# Patient Record
Sex: Male | Born: 1975 | Race: White | Hispanic: Yes | Smoking: Former smoker
Health system: Southern US, Community
[De-identification: ages and names within clinical notes are randomized; demographics above are authoritative.]

## PROBLEM LIST (undated history)

## (undated) DIAGNOSIS — M6780 Other specified disorders of synovium and tendon, unspecified site: Secondary | ICD-10-CM

## (undated) DIAGNOSIS — M24541 Contracture, right hand: Secondary | ICD-10-CM

---

## 2011-10-23 ENCOUNTER — Ambulatory Visit: Payer: Self-pay

## 2011-10-23 LAB — CBC WITH DIFFERENTIAL/PLATELET
Basophil #: 0 10*3/uL (ref 0.0–0.1)
HCT: 48.5 % (ref 40.0–52.0)
Lymphocyte #: 2.3 10*3/uL (ref 1.0–3.6)
MCH: 31.3 pg (ref 26.0–34.0)
MCHC: 34.4 g/dL (ref 32.0–36.0)
MCV: 91 fL (ref 80–100)
Monocyte #: 0.6 x10 3/mm (ref 0.2–1.0)
Monocyte %: 6.5 %
Neutrophil #: 6.3 10*3/uL (ref 1.4–6.5)
Platelet: 264 10*3/uL (ref 150–440)
RDW: 13 % (ref 11.5–14.5)
WBC: 9.5 10*3/uL (ref 3.8–10.6)

## 2011-10-23 LAB — COMPREHENSIVE METABOLIC PANEL
Albumin: 4.2 g/dL (ref 3.4–5.0)
Anion Gap: 8 (ref 7–16)
BUN: 13 mg/dL (ref 7–18)
Glucose: 112 mg/dL — ABNORMAL HIGH (ref 65–99)
Osmolality: 277 (ref 275–301)
Potassium: 3.9 mmol/L (ref 3.5–5.1)
Sodium: 138 mmol/L (ref 136–145)
Total Protein: 7.9 g/dL (ref 6.4–8.2)

## 2011-11-03 ENCOUNTER — Ambulatory Visit: Payer: Self-pay | Admitting: Internal Medicine

## 2012-09-13 ENCOUNTER — Ambulatory Visit: Payer: Self-pay

## 2012-09-17 ENCOUNTER — Emergency Department (HOSPITAL_COMMUNITY): Payer: Self-pay

## 2012-09-17 ENCOUNTER — Observation Stay (HOSPITAL_COMMUNITY)
Admission: EM | Admit: 2012-09-17 | Discharge: 2012-09-18 | Disposition: A | Discharge status: Home / Self Care | Payer: Self-pay | Attending: General Surgery | Admitting: General Surgery

## 2012-09-17 ENCOUNTER — Emergency Department (HOSPITAL_COMMUNITY): Payer: Self-pay | Admitting: Anesthesiology

## 2012-09-17 ENCOUNTER — Encounter (HOSPITAL_COMMUNITY): Payer: Self-pay | Admitting: Anesthesiology

## 2012-09-17 ENCOUNTER — Encounter (HOSPITAL_COMMUNITY)
Admission: EM | Disposition: A | Discharge status: Home / Self Care | Payer: Self-pay | Source: Home / Self Care | Attending: Emergency Medicine

## 2012-09-17 ENCOUNTER — Encounter (HOSPITAL_COMMUNITY): Payer: Self-pay | Admitting: Emergency Medicine

## 2012-09-17 DIAGNOSIS — K612 Anorectal abscess: Principal | ICD-10-CM | POA: Insufficient documentation

## 2012-09-17 DIAGNOSIS — K611 Rectal abscess: Secondary | ICD-10-CM

## 2012-09-17 DIAGNOSIS — Z79899 Other long term (current) drug therapy: Secondary | ICD-10-CM | POA: Insufficient documentation

## 2012-09-17 HISTORY — PX: INCISION AND DRAINAGE PERIRECTAL ABSCESS: SHX1804

## 2012-09-17 LAB — CBC
HCT: 41.8 % (ref 39.0–52.0)
MCH: 31.2 pg (ref 26.0–34.0)
MCV: 88.7 fL (ref 78.0–100.0)
Platelets: 314 10*3/uL (ref 150–400)
RBC: 4.71 MIL/uL (ref 4.22–5.81)

## 2012-09-17 LAB — BASIC METABOLIC PANEL
BUN: 9 mg/dL (ref 6–23)
CO2: 28 mEq/L (ref 19–32)
Calcium: 9.8 mg/dL (ref 8.4–10.5)
Creatinine, Ser: 0.7 mg/dL (ref 0.50–1.35)
Glucose, Bld: 104 mg/dL — ABNORMAL HIGH (ref 70–99)

## 2012-09-17 SURGERY — INCISION AND DRAINAGE, ABSCESS, PERIRECTAL
Anesthesia: General | Site: Buttocks | Wound class: Dirty or Infected

## 2012-09-17 MED ORDER — ONDANSETRON HCL 4 MG/2ML IJ SOLN
INTRAMUSCULAR | Status: DC | PRN
  Administered 2012-09-17: 4 mg via INTRAVENOUS

## 2012-09-17 MED ORDER — ONDANSETRON HCL 4 MG PO TABS: 4.0000 mg | ORAL_TABLET | Freq: Four times a day (QID) | ORAL | Status: DC | PRN

## 2012-09-17 MED ORDER — ARTIFICIAL TEARS OP OINT
TOPICAL_OINTMENT | OPHTHALMIC | Status: DC | PRN
  Administered 2012-09-17: 1 via OPHTHALMIC

## 2012-09-17 MED ORDER — HYDROMORPHONE HCL PF 1 MG/ML IJ SOLN
INTRAMUSCULAR | Status: AC
  Filled 2012-09-17: qty 1

## 2012-09-17 MED ORDER — METRONIDAZOLE IN NACL 5-0.79 MG/ML-% IV SOLN
500.0000 mg | Freq: Three times a day (TID) | INTRAVENOUS | Status: DC
  Administered 2012-09-17 – 2012-09-18 (×2): 500 mg via INTRAVENOUS
  Filled 2012-09-17 (×4): qty 100

## 2012-09-17 MED ORDER — PROPOFOL 10 MG/ML IV BOLUS
INTRAVENOUS | Status: DC | PRN
  Administered 2012-09-17: 200 mg via INTRAVENOUS

## 2012-09-17 MED ORDER — PANTOPRAZOLE SODIUM 40 MG PO TBEC
40.0000 mg | DELAYED_RELEASE_TABLET | Freq: Every day | ORAL | Status: DC
  Administered 2012-09-17 – 2012-09-18 (×2): 40 mg via ORAL
  Filled 2012-09-17: qty 1

## 2012-09-17 MED ORDER — BUPIVACAINE HCL 0.25 % IJ SOLN
INTRAMUSCULAR | Status: DC | PRN
  Administered 2012-09-17: 20 mL

## 2012-09-17 MED ORDER — MORPHINE SULFATE 4 MG/ML IJ SOLN
4.0000 mg | Freq: Once | INTRAMUSCULAR | Status: AC
  Administered 2012-09-17: 4 mg via INTRAVENOUS
  Filled 2012-09-17: qty 1

## 2012-09-17 MED ORDER — FENTANYL CITRATE 0.05 MG/ML IJ SOLN
INTRAMUSCULAR | Status: DC | PRN
  Administered 2012-09-17: 250 ug via INTRAVENOUS

## 2012-09-17 MED ORDER — METOCLOPRAMIDE HCL 5 MG/ML IJ SOLN: 10.0000 mg | Freq: Once | INTRAMUSCULAR | Status: DC | PRN

## 2012-09-17 MED ORDER — ONDANSETRON HCL 4 MG/2ML IJ SOLN: 4.0000 mg | Freq: Four times a day (QID) | INTRAMUSCULAR | Status: DC | PRN

## 2012-09-17 MED ORDER — HYDROMORPHONE HCL PF 1 MG/ML IJ SOLN
0.2500 mg | INTRAMUSCULAR | Status: DC | PRN
  Administered 2012-09-17 (×2): 0.5 mg via INTRAVENOUS

## 2012-09-17 MED ORDER — OXYCODONE HCL 5 MG/5ML PO SOLN: 5.0000 mg | Freq: Once | ORAL | Status: DC | PRN

## 2012-09-17 MED ORDER — HEPARIN SODIUM (PORCINE) 5000 UNIT/ML IJ SOLN
5000.0000 [IU] | Freq: Three times a day (TID) | INTRAMUSCULAR | Status: DC
  Administered 2012-09-18: 5000 [IU] via SUBCUTANEOUS
  Filled 2012-09-17 (×3): qty 1

## 2012-09-17 MED ORDER — CIPROFLOXACIN IN D5W 400 MG/200ML IV SOLN
400.0000 mg | Freq: Once | INTRAVENOUS | Status: AC
  Administered 2012-09-17: 400 mg via INTRAVENOUS
  Filled 2012-09-17: qty 200

## 2012-09-17 MED ORDER — CIPROFLOXACIN IN D5W 400 MG/200ML IV SOLN
INTRAVENOUS | Status: DC | PRN
  Administered 2012-09-17: 400 mg via INTRAVENOUS

## 2012-09-17 MED ORDER — LACTATED RINGERS IV SOLN
INTRAVENOUS | Status: DC | PRN
  Administered 2012-09-17: 14:00:00 via INTRAVENOUS

## 2012-09-17 MED ORDER — METRONIDAZOLE IN NACL 5-0.79 MG/ML-% IV SOLN
500.0000 mg | Freq: Once | INTRAVENOUS | Status: AC
  Administered 2012-09-17: 500 mg via INTRAVENOUS
  Filled 2012-09-17: qty 100

## 2012-09-17 MED ORDER — KCL IN DEXTROSE-NACL 20-5-0.45 MEQ/L-%-% IV SOLN
INTRAVENOUS | Status: DC
  Administered 2012-09-17: 17:00:00 via INTRAVENOUS
  Filled 2012-09-17 (×3): qty 1000

## 2012-09-17 MED ORDER — OXYCODONE HCL 5 MG PO TABS: 5.0000 mg | ORAL_TABLET | Freq: Once | ORAL | Status: DC | PRN

## 2012-09-17 MED ORDER — LIDOCAINE HCL (CARDIAC) 20 MG/ML IV SOLN
INTRAVENOUS | Status: DC | PRN
  Administered 2012-09-17: 100 mg via INTRAVENOUS

## 2012-09-17 MED ORDER — ONDANSETRON 4 MG PO TBDP
4.0000 mg | ORAL_TABLET | Freq: Once | ORAL | Status: AC
  Administered 2012-09-17: 4 mg via ORAL
  Filled 2012-09-17: qty 1

## 2012-09-17 MED ORDER — MORPHINE SULFATE 2 MG/ML IJ SOLN
2.0000 mg | INTRAMUSCULAR | Status: DC | PRN
  Administered 2012-09-17 (×2): 2 mg via INTRAVENOUS
  Filled 2012-09-17 (×2): qty 1

## 2012-09-17 MED ORDER — IOHEXOL 300 MG/ML  SOLN
125.0000 mL | Freq: Once | INTRAMUSCULAR | Status: AC | PRN
  Administered 2012-09-17: 125 mL via INTRAVENOUS

## 2012-09-17 MED ORDER — MIDAZOLAM HCL 5 MG/5ML IJ SOLN
INTRAMUSCULAR | Status: DC | PRN
  Administered 2012-09-17: 2 mg via INTRAVENOUS

## 2012-09-17 MED ORDER — IOHEXOL 300 MG/ML  SOLN
25.0000 mL | Freq: Once | INTRAMUSCULAR | Status: AC | PRN
  Administered 2012-09-17: 25 mL via ORAL

## 2012-09-17 MED ORDER — HYDROMORPHONE HCL PF 1 MG/ML IJ SOLN
1.0000 mg | Freq: Once | INTRAMUSCULAR | Status: AC
  Administered 2012-09-17: 1 mg via INTRAVENOUS
  Filled 2012-09-17: qty 1

## 2012-09-17 MED ORDER — 0.9 % SODIUM CHLORIDE (POUR BTL) OPTIME
TOPICAL | Status: DC | PRN
  Administered 2012-09-17: 1000 mL

## 2012-09-17 MED ORDER — BUPIVACAINE-EPINEPHRINE PF 0.25-1:200000 % IJ SOLN
INTRAMUSCULAR | Status: AC
  Filled 2012-09-17: qty 30

## 2012-09-17 MED ORDER — CIPROFLOXACIN IN D5W 400 MG/200ML IV SOLN
400.0000 mg | Freq: Two times a day (BID) | INTRAVENOUS | Status: DC
  Administered 2012-09-18: 400 mg via INTRAVENOUS
  Filled 2012-09-17 (×3): qty 200

## 2012-09-17 MED ORDER — HYDROCODONE-ACETAMINOPHEN 5-325 MG PO TABS
1.0000 | ORAL_TABLET | ORAL | Status: DC | PRN
  Administered 2012-09-17 – 2012-09-18 (×3): 2 via ORAL
  Filled 2012-09-17 (×3): qty 2

## 2012-09-17 MED ORDER — SUCCINYLCHOLINE CHLORIDE 20 MG/ML IJ SOLN
INTRAMUSCULAR | Status: DC | PRN
  Administered 2012-09-17: 100 mg via INTRAVENOUS

## 2012-09-17 SURGICAL SUPPLY — 32 items
BLADE SURG 15 STRL LF DISP TIS (BLADE) ×1 IMPLANT
BLADE SURG 15 STRL SS (BLADE) ×1
CANISTER SUCTION 2500CC (MISCELLANEOUS) ×2 IMPLANT
CLEANER TIP ELECTROSURG 2X2 (MISCELLANEOUS) ×2 IMPLANT
CLOTH BEACON ORANGE TIMEOUT ST (SAFETY) ×2 IMPLANT
COVER SURGICAL LIGHT HANDLE (MISCELLANEOUS) ×2 IMPLANT
DRAPE UTILITY 15X26 W/TAPE STR (DRAPE) ×4 IMPLANT
DRSG PAD ABDOMINAL 8X10 ST (GAUZE/BANDAGES/DRESSINGS) IMPLANT
ELECT REM PT RETURN 9FT ADLT (ELECTROSURGICAL) ×2
ELECTRODE REM PT RTRN 9FT ADLT (ELECTROSURGICAL) ×1 IMPLANT
GAUZE PACKING IODOFORM 1 (PACKING) ×2 IMPLANT
GAUZE SPONGE 4X4 16PLY XRAY LF (GAUZE/BANDAGES/DRESSINGS) ×2 IMPLANT
GLOVE BIO SURGEON STRL SZ8 (GLOVE) ×2 IMPLANT
GLOVE BIOGEL PI IND STRL 8 (GLOVE) ×1 IMPLANT
GLOVE BIOGEL PI INDICATOR 8 (GLOVE) ×1
GOWN STRL NON-REIN LRG LVL3 (GOWN DISPOSABLE) ×4 IMPLANT
GOWN STRL REIN XL XLG (GOWN DISPOSABLE) ×2 IMPLANT
KIT BASIN OR (CUSTOM PROCEDURE TRAY) ×2 IMPLANT
KIT ROOM TURNOVER OR (KITS) ×2 IMPLANT
NS IRRIG 1000ML POUR BTL (IV SOLUTION) ×2 IMPLANT
PACK LITHOTOMY IV (CUSTOM PROCEDURE TRAY) ×2 IMPLANT
PAD ARMBOARD 7.5X6 YLW CONV (MISCELLANEOUS) ×4 IMPLANT
PENCIL BUTTON HOLSTER BLD 10FT (ELECTRODE) ×2 IMPLANT
SPONGE GAUZE 4X4 12PLY (GAUZE/BANDAGES/DRESSINGS) ×2 IMPLANT
SWAB COLLECTION DEVICE MRSA (MISCELLANEOUS) ×2 IMPLANT
TOWEL OR 17X24 6PK STRL BLUE (TOWEL DISPOSABLE) ×2 IMPLANT
TOWEL OR 17X26 10 PK STRL BLUE (TOWEL DISPOSABLE) ×2 IMPLANT
TUBE ANAEROBIC SPECIMEN COL (MISCELLANEOUS) ×2 IMPLANT
TUBE CONNECTING 12X1/4 (SUCTIONS) ×2 IMPLANT
UNDERPAD 30X30 INCONTINENT (UNDERPADS AND DIAPERS) ×2 IMPLANT
WATER STERILE IRR 1000ML POUR (IV SOLUTION) IMPLANT
YANKAUER SUCT BULB TIP NO VENT (SUCTIONS) ×2 IMPLANT

## 2012-09-17 NOTE — ED Notes (Signed)
Pt fiance has pt belongings

## 2012-09-17 NOTE — Transfer of Care (Signed)
Immediate Anesthesia Transfer of Care Note  Patient: [name] [name]  Procedure(s) Performed: Procedure(s): IRRIGATION AND DEBRIDEMENT PERIRECTAL ABSCESS (N/A)  Patient Location: PACU  Anesthesia Type:General  Level of Consciousness: awake, alert , oriented, patient cooperative and responds to stimulation  Airway & Oxygen Therapy: Patient Spontanous Breathing and Patient connected to nasal cannula oxygen  Post-op Assessment: Report given to PACU RN, Post -op Vital signs reviewed and stable and Patient moving all extremities X 4  Post vital signs: Reviewed and stable  Complications: No apparent anesthesia complications

## 2012-09-17 NOTE — ED Notes (Addendum)
Pt c/o rectal abscess onset Monday. Pt seen by PMD for same and given prescription meds, pt seen at [facility] and was told it needed to be removed by surgeon. Pt took 2 hydrocodone at 0400.

## 2012-09-17 NOTE — Preoperative (Signed)
Beta Blockers   Reason not to administer Beta Blockers:Not Applicable 

## 2012-09-17 NOTE — H&P (Signed)
[name] [name] is an 37 y.o. male.   Chief Complaint: Left perirectal pain HPI: Patient complains of (for pain for the past 7 days. He was seen in an urgent care and prescribed doxycycline, hydrocodone, and some hemorrhoid ointment. He has not had any relief. He has not had any drainage from the area. It is keeping him from sleeping. He was evaluated emergency department today. Workup shows mild leukocytosis and CT scan of the abdomen and pelvis shows left perirectal abscess.  Past medical history: Negative  Past surgical history: Negative  No family history on file. Social History: Does not smoke, drinks multiple 24 ounce beers daily after work  Allergies: No Known Allergies   (Not in a hospital admission)  Results for orders placed during the hospital encounter of 09/17/12 (from the past 48 hour(s))  CBC     Status: Abnormal   Collection Time    09/17/12 10:55 AM      Result Value Range   WBC 13.0 (*) 4.0 - 10.5 K/uL   RBC 4.71  4.22 - 5.81 MIL/uL   Hemoglobin 14.7  13.0 - 17.0 g/dL   HCT 16.1  09.6 - 04.5 %   MCV 88.7  78.0 - 100.0 fL   MCH 31.2  26.0 - 34.0 pg   MCHC 35.2  30.0 - 36.0 g/dL   RDW 40.9  81.1 - 91.4 %   Platelets 314  150 - 400 K/uL  BASIC METABOLIC PANEL     Status: Abnormal   Collection Time    09/17/12 10:55 AM      Result Value Range   Sodium 137  135 - 145 mEq/L   Potassium 3.3 (*) 3.5 - 5.1 mEq/L   Chloride 99  96 - 112 mEq/L   CO2 28  19 - 32 mEq/L   Glucose, Bld 104 (*) 70 - 99 mg/dL   BUN 9  6 - 23 mg/dL   Creatinine, Ser 7.82  0.50 - 1.35 mg/dL   Calcium 9.8  8.4 - 95.6 mg/dL   GFR calc non Af Amer >90  >90 mL/min   GFR calc Af Amer >90  >90 mL/min   Comment:            The eGFR has been calculated     using the CKD EPI equation.     This calculation has not been     validated in all clinical     situations.     eGFR's persistently     <90 mL/min signify     possible Chronic Kidney Disease.   Ct Abdomen Pelvis W Contrast  09/17/2012    *RADIOLOGY REPORT*  Clinical Data: Evaluate for abscess.  CT ABDOMEN AND PELVIS WITH CONTRAST  Technique:  Multidetector CT imaging of the abdomen and pelvis was performed following the standard protocol during bolus administration of intravenous contrast.  Contrast: 25mL OMNIPAQUE IOHEXOL 300 MG/ML  SOLN, OMNIPAQUE IOHEXOL 300 MG/ML  SOLN  Comparison: None.  Findings:  There is no pleural effusion identified.  The lung bases appear clear.  No focal liver abnormality.  The gallbladder is normal.  No biliary dilatation.  Normal appearance of the pancreas.  The spleen is normal.  The adrenal glands are both within normal limits.  Right kidney is normal.  The left kidney is normal.  The urinary bladder is unremarkable.  Normal caliber of the abdominal aorta.  No upper abdominal adenopathy.  No pelvic or inguinal adenopathy identified.  The stomach appears normal.  The small bowel loops are unremarkable.  The appendix is visualized and appears normal. Normal caliber of the colon.  There is a peripherally enhancing, low attenuation structure within the left side, perirectal soft tissues.  This measures 3.8 x 1.1 x 3.0 cm.  Review of the visualized osseous structures was unremarkable.  IMPRESSION:  1.  Exam positive for perirectal fluid collection compatible with abscess. 2.  No intra-abdominal or pelvic abnormalities.   Original Report Authenticated By: Signa Kell, M.D.    Review of Systems  Constitutional: Positive for malaise/fatigue.  HENT: Negative.   Eyes: Negative.   Respiratory: Negative.   Cardiovascular: Negative.   Gastrointestinal: Negative for abdominal pain and blood in stool.       See HPI  Genitourinary: Negative.   Musculoskeletal: Negative.   Skin: Negative.   Neurological: Negative.   Endo/Heme/Allergies: Negative.   Psychiatric/Behavioral: Negative.     Blood pressure 117/65, pulse 76, temperature 98.9 F (37.2 C), temperature source Oral, resp. rate 20, height 5\' 5"   (1.651 m), weight 78.019 kg (172 lb), SpO2 100.00%. Physical Exam  Constitutional: He is oriented to person, place, and time. He appears well-developed and well-nourished. He appears distressed.  HENT:  Head: Normocephalic and atraumatic.  Mouth/Throat: No oropharyngeal exudate.  Eyes: EOM are normal. Pupils are equal, round, and reactive to light. No scleral icterus.  Neck: Normal range of motion. Neck supple. No tracheal deviation present.  Cardiovascular: Normal rate, regular rhythm, normal heart sounds and intact distal pulses.   No murmur heard. Respiratory: Effort normal and breath sounds normal. No stridor. No respiratory distress. He has no wheezes. He has no rales. He exhibits no tenderness.  GI: Soft. He exhibits no distension. There is no tenderness. There is no rebound and no guarding.  Genitourinary:  Left pararectal region with no overlying skin changes but palpable tender mass consistent with location of the abscess  Musculoskeletal: Normal range of motion. He exhibits no edema and no tenderness.  Neurological: He is alert and oriented to person, place, and time. He exhibits normal muscle tone.  Skin: Skin is warm.     Assessment/Plan (Collapse S.: We'll give IV antibiotics and take to the operating room for incision and drainage. Procedure, risks, and benefits were discussed in detail with the patient. He agrees. We will plan to keep him overnight for further antibiotic treatment and wound care.  Sawyer Kahan E 09/17/2012, 1:18 PM

## 2012-09-17 NOTE — ED Provider Notes (Signed)
   History    CSN: [id number] Arrival date & time 09/17/12  4098  First MD Initiated Contact with Patient 09/17/12 1033     Chief Complaint  Patient presents with  . Abscess    rectal   (Consider location/radiation/quality/duration/timing/severity/associated sxs/prior Treatment) HPI  [name] [name] is a(n) 37 y.o. male who presents with chief complaint of rectal abscess.  Patient states he has had worsening pain in the rectum over the past week.  [facility] and told that he needed to follow up with a surgeon.  Patient was started on doxycycline, ketoconazole cream to apply externally to the rectum and pain medication.  It has gotten increasingly worse since that time.  The patient sought medical attention here at the emergency department.  He states he's been afebrile over yes had myalgias, chills and night sweats. He denies history of similar symptoms.  He denies a history of ulcerative colitis or Crohn's disease. He has pain with defecation but denies any hematochezia, melena or diarrhea.  Patient denies any urinary symptoms.  History reviewed. No pertinent past medical history. History reviewed. No pertinent past surgical history. No family history on file. History  Substance Use Topics  . Smoking status: Never Smoker   . Smokeless tobacco: Not on file  . Alcohol Use: Yes    Review of Systems Ten systems reviewed and are negative for acute change, except as noted in the HPI.   Allergies  Review of patient's allergies indicates no known allergies.  Home Medications  No current outpatient prescriptions on file. BP 130/79  Pulse 81  Temp(Src) 98.9 F (37.2 C) (Oral)  Resp 20  Ht 5\' 5"  (1.651 m)  Wt 172 lb (78.019 kg)  BMI 28.62 kg/m2  SpO2 100% Physical Exam Physical Exam  Nursing note and vitals reviewed. Constitutional: He appears well-developed and well-nourished. No distress.  HENT:  Head: Normocephalic and atraumatic.  Eyes:  Conjunctivae normal are normal. No scleral icterus.  Neck: Normal range of motion. Neck supple.  Cardiovascular: Normal rate, regular rhythm and normal heart sounds.   Pulmonary/Chest: Effort normal and breath sounds normal. No respiratory distress.  Abdominal: Soft. There is no tenderness.  Musculoskeletal: He exhibits no edema.  Neurological: He is alert.  Skin: Skin is warm and dry. He is not diaphoretic.  Psychiatric: His behavior is normal.  Rectal exam: tenderness noted at 3 O'clock. Minimally indurated. No fluctuance.    ED Course  Procedures (including critical care time) Labs Reviewed - No data to display No results found. 1. Perirectal abscess     MDM  12:39 PM BP 117/65  Pulse 76  Temp(Src) 98.9 F (37.2 C) (Oral)  Resp 20  Ht 5\' 5"  (1.651 m)  Wt 172 lb (78.019 kg)  BMI 28.62 kg/m2  SpO2 100% Patient CT positive for  Deep perirectal abscess. Elevated white count. Afebrile.  Pain in better control with dilaudid. I have spoken with Dr. Janee Morn who has agreed to see the patient. Patient given IV Cipro.      Arthor Captain, PA-C 09/17/12 1607

## 2012-09-17 NOTE — Anesthesia Postprocedure Evaluation (Signed)
Anesthesia Post Note  Patient: [name] [name]  Procedure(s) Performed: Procedure(s) (LRB): IRRIGATION AND DEBRIDEMENT PERIRECTAL ABSCESS (N/A)  Anesthesia type: General  Patient location: PACU  Post pain: Pain level controlled  Post assessment: Patient's Cardiovascular Status Stable  Last Vitals:  Filed Vitals:   09/17/12 1600  BP: 122/55  Pulse: 88  Temp: 36.8 C  Resp: 16    Post vital signs: Reviewed and stable  Level of consciousness: alert  Complications: No apparent anesthesia complications

## 2012-09-17 NOTE — ED Notes (Signed)
Pt has returned from ct 

## 2012-09-17 NOTE — Anesthesia Preprocedure Evaluation (Addendum)
Anesthesia Evaluation  Patient identified by MRN, date of birth, ID band Patient awake    Reviewed: Allergy & Precautions, H&P , NPO status , Patient's Chart, lab work & pertinent test results, reviewed documented beta blocker date and time   Airway Mallampati: II TM Distance: >3 FB Neck ROM: full    Dental  (+) Teeth Intact and Dental Advidsory Given   Pulmonary neg pulmonary ROS,  breath sounds clear to auscultation        Cardiovascular negative cardio ROS  Rhythm:regular     Neuro/Psych negative neurological ROS  negative psych ROS   GI/Hepatic negative GI ROS, Neg liver ROS,   Endo/Other  negative endocrine ROS  Renal/GU negative Renal ROS  negative genitourinary   Musculoskeletal   Abdominal   Peds  Hematology negative hematology ROS (+)   Anesthesia Other Findings See surgeon's H&P   Reproductive/Obstetrics negative OB ROS                          Anesthesia Physical Anesthesia Plan  ASA: I and emergent  Anesthesia Plan: General   Post-op Pain Management:    Induction: Intravenous, Rapid sequence and Cricoid pressure planned  Airway Management Planned: Oral ETT  Additional Equipment:   Intra-op Plan:   Post-operative Plan: Extubation in OR  Informed Consent: I have reviewed the patients History and Physical, chart, labs and discussed the procedure including the risks, benefits and alternatives for the proposed anesthesia with the patient or authorized representative who has indicated his/her understanding and acceptance.   Dental Advisory Given  Plan Discussed with: CRNA, Surgeon and Anesthesiologist  Anesthesia Plan Comments:        Anesthesia Quick Evaluation

## 2012-09-17 NOTE — Op Note (Signed)
09/17/2012  2:44 PM  PATIENT:  [name] [name]  37 y.o. male  PRE-OPERATIVE DIAGNOSIS:  Large Left perirectal abscess  POST-OPERATIVE DIAGNOSIS:  Large Left perirectal abscess  PROCEDURE:  Procedure(s): IRRIGATION AND DEBRIDEMENT PERIRECTAL ABSCESS  SURGEON:  Surgeon(s): Liz Malady, MD  PHYSICIAN ASSISTANT:   ASSISTANTS: none   ANESTHESIA:   local and general  EBL:     BLOOD ADMINISTERED:none  DRAINS: none   SPECIMEN:  No Specimen  DISPOSITION OF SPECIMEN:  N/A  COUNTS:  YES  DICTATION: .Dragon Dictation  Patient presented to the emergency department with one week of left perirectal pain. He was seen as an urgent care a couple of days ago and given doxycycline and pain medicine. He continued to have severe pain. Workup in the emergency department demonstrated a large left perirectal abscess. He is brought emergently for incision and drainage.He was given intravenous antibiotics. Informed consent was obtained. He was brought to the operating room and general endotracheal anesthesia was administered by the anesthesia staff. He was placed in lithotomy position. Perineum was prepped and draped in sterile fashion. Incision was made on the left side of the pararectal region over a deeply palpable mass. Subcutaneous tissues were dissected down and entered the abscess cavity. There is a large volume of pus which was evacuated. This was sent for cultures. Loculations were broken down and the entire cavity was opened. It extended the skin incision some to facilitate this. Next hemostasis was obtained with cautery. The wound was copiously irrigated with saline. Local anesthetic was injected. There was good hemostasis and the wound was packed with one-inch iodoform gauze. Bulky sterile dressing was applied. Patient tolerated procedure well without apparent complication. All counts were correct. He was taken recovery in stable condition.  PATIENT DISPOSITION:  PACU - hemodynamically  stable.   Delay start of Pharmacological VTE agent (>24hrs) due to surgical blood loss or risk of bleeding:  no  Violeta Gelinas, MD, MPH, FACS Pager: [phone]  7/20/20142:44 PM

## 2012-09-18 ENCOUNTER — Encounter (HOSPITAL_COMMUNITY): Payer: Self-pay | Admitting: General Surgery

## 2012-09-18 LAB — BASIC METABOLIC PANEL
GFR calc Af Amer: >90 mL/min (ref >90)
GFR calc non Af Amer: >90 mL/min (ref >90)
Potassium: 3.7 mEq/L (ref 3.5–5.1)
Sodium: 140 mEq/L (ref 135–145)

## 2012-09-18 LAB — CBC
MCHC: 33.3 g/dL (ref 30.0–36.0)
Platelets: 329 10*3/uL (ref 150–400)
RDW: 12.3 % (ref 11.5–15.5)

## 2012-09-18 MED ORDER — DOXYCYCLINE HYCLATE 100 MG PO TABS: 100.0000 mg | ORAL_TABLET | Freq: Two times a day (BID) | ORAL | Status: DC

## 2012-09-18 MED ORDER — METRONIDAZOLE 500 MG PO TABS: 500.0000 mg | ORAL_TABLET | Freq: Three times a day (TID) | ORAL | Status: DC

## 2012-09-18 MED ORDER — CIPROFLOXACIN HCL 500 MG PO TABS
500.0000 mg | ORAL_TABLET | Freq: Two times a day (BID) | ORAL | Status: DC
  Administered 2012-09-18: 500 mg via ORAL
  Filled 2012-09-18: qty 1

## 2012-09-18 MED ORDER — METRONIDAZOLE 500 MG PO TABS
500.0000 mg | ORAL_TABLET | Freq: Three times a day (TID) | ORAL | Status: DC
  Filled 2012-09-18: qty 1

## 2012-09-18 MED ORDER — CIPROFLOXACIN HCL 500 MG PO TABS: 500.0000 mg | ORAL_TABLET | Freq: Two times a day (BID) | ORAL | Status: DC

## 2012-09-18 MED ORDER — HYDROCODONE-ACETAMINOPHEN 5-325 MG PO TABS: 1.0000 | ORAL_TABLET | ORAL | Status: DC | PRN

## 2012-09-18 NOTE — Discharge Summary (Signed)
Physician Discharge Summary  [name] [name] ZOX:[id number] DOB: 1975/03/06 DOA: 09/17/2012   Consultation: none  Admit date: 09/17/2012 Discharge date: 09/18/2012  Recommendations for Outpatient Follow-up:  1.  recommended home health for dressing changes  Follow-up Information   Follow up with Liz Malady, MD On 10/02/2012. (appointment:3:15PM.  Please arrive prior to your appointment to complete check in)    Contact information:   [address] [place] [address] [phone]      Discharge Diagnoses:  1. Perirectal abscess   Surgical Procedure: Irrigation and Debridement of perirectal abscess(Dr. Janee Morn 09/17/12)  Discharge Condition: stable Disposition: home  Diet recommendation: regular  Filed Weights   09/17/12 1017  Weight: 172 lb (78.019 kg)    Filed Vitals:   09/18/12 0955  BP: 128/68  Pulse: 79  Temp: 98.4 F (36.9 C)  Resp: 19     Hospital Course:  [name] [name] is a healthy 37 year old Hispanic male who presented to [facility] with left sided perirectal pain.  He was prescribed doxycycline, pain medication and hemorrhoid cream at the urgent care, did not help his symptoms.  He underwent a CT which showed an abscess.  He then underwent and I&D.  On POD #1 his pain was under good control with oral pain medication.  His dressing was changed which he tolerated well.  He had stable vital signs and improving WBC.  He was voiding, having bms, ambulating and tolerating a regular diet.  He was therefore felt stable for discharge. We recommended home health, but he was unable to afford.  His significant other and a friend are nurses and will help with dressing changes.  He was given supplies and rx for pain medication and atbx.  We discussed medication risks and benefits.  We discussed warning signs that warrant immediate attention.  A follow up appt was provided for the patient.    Discharge Instructions   Future Appointments Provider Department  Dept Phone   10/02/2012 3:15 PM Liz Malady, MD [facility], [place] [phone]       Medication List    STOP taking these medications       doxycycline 100 MG tablet  Commonly known as:  VIBRA-TABS     HYDROcodone-acetaminophen 10-325 MG per tablet  Commonly known as:  NORCO  Replaced by:  HYDROcodone-acetaminophen 5-325 MG per tablet      TAKE these medications       ciprofloxacin 500 MG tablet  Commonly known as:  CIPRO  Take 1 tablet (500 mg total) by mouth 2 (two) times daily.     HYDROcodone-acetaminophen 5-325 MG per tablet  Commonly known as:  NORCO/VICODIN  Take 1-2 tablets by mouth every 4 (four) hours as needed.     ketoconazole 2 % cream  Commonly known as:  NIZORAL  Apply 1 application topically daily.     metroNIDAZOLE 500 MG tablet  Commonly known as:  FLAGYL  Take 1 tablet (500 mg total) by mouth every 8 (eight) hours.           Follow-up Information   Follow up with Liz Malady, MD On 10/02/2012. (appointment:3:15PM.  Please arrive prior to your appointment to complete check in)    Contact information:   [address] [place] [address] [phone]        The results of significant diagnostics from this hospitalization (including imaging, microbiology, ancillary and laboratory) are listed below for reference.    Significant  Diagnostic Studies: Ct Abdomen Pelvis W Contrast  09/17/2012   *RADIOLOGY REPORT*  Clinical Data: Evaluate for abscess.  CT ABDOMEN AND PELVIS WITH CONTRAST  Technique:  Multidetector CT imaging of the abdomen and pelvis was performed following the standard protocol during bolus administration of intravenous contrast.  Contrast: 25mL OMNIPAQUE IOHEXOL 300 MG/ML  SOLN, OMNIPAQUE IOHEXOL 300 MG/ML  SOLN  Comparison: None.  Findings:  There is no pleural effusion identified.  The lung bases appear clear.  No focal liver abnormality.  The gallbladder is normal.  No biliary dilatation.   Normal appearance of the pancreas.  The spleen is normal.  The adrenal glands are both within normal limits.  Right kidney is normal.  The left kidney is normal.  The urinary bladder is unremarkable.  Normal caliber of the abdominal aorta.  No upper abdominal adenopathy.  No pelvic or inguinal adenopathy identified.  The stomach appears normal.  The small bowel loops are unremarkable.  The appendix is visualized and appears normal. Normal caliber of the colon.  There is a peripherally enhancing, low attenuation structure within the left side, perirectal soft tissues.  This measures 3.8 x 1.1 x 3.0 cm.  Review of the visualized osseous structures was unremarkable.  IMPRESSION:  1.  Exam positive for perirectal fluid collection compatible with abscess. 2.  No intra-abdominal or pelvic abnormalities.   Original Report Authenticated By: Signa Kell, M.D.    Microbiology: Recent Results (from the past 240 hour(s))  CULTURE, ROUTINE-ABSCESS     Status: None   Collection Time    09/17/12  2:37 PM      Result Value Range Status   Specimen Description ABSCESS PERIRECTAL   Final   Special Requests PATIENT ON FOLLOWING ZOSYN AND CIPRO   Final   Gram Stain PENDING   Incomplete   Culture Culture reincubated for better growth   Final   Report Status PENDING   Incomplete  ANAEROBIC CULTURE     Status: None   Collection Time    09/17/12  2:37 PM      Result Value Range Status   Specimen Description ABSCESS PERIRECTAL   Final   Special Requests PATIENT ON FOLLOWING ZOSYN AND CIPRO   Final   Gram Stain PENDING   Incomplete   Culture     Final   Value: NO ANAEROBES ISOLATED; CULTURE IN PROGRESS FOR 5 DAYS   Report Status PENDING   Incomplete     Labs: Basic Metabolic Panel:  Recent Labs Lab 09/17/12 1055 09/18/12 0545  NA 137 140  K 3.3* 3.7  CL 99 99  CO2 28 32  GLUCOSE 104* 104*  BUN 9 7  CREATININE 0.70 0.69  CALCIUM 9.8 9.6   Liver Function Tests: No results found for this basename:  AST, ALT, ALKPHOS, BILITOT, PROT, ALBUMIN,  in the last 168 hours No results found for this basename: LIPASE, AMYLASE,  in the last 168 hours No results found for this basename: AMMONIA,  in the last 168 hours CBC:  Recent Labs Lab 09/17/12 1055 09/18/12 0545  WBC 13.0* 10.6*  HGB 14.7 14.2  HCT 41.8 42.7  MCV 88.7 90.7  PLT 314 329    Time coordinating discharge: 30 mins  Signed:  Phaedra Colgate, ANP-BC

## 2012-09-18 NOTE — Progress Notes (Signed)
1 Day Post-Op  Subjective: He was doing better till I pulled packing out.  Over all he did quite well.  Objective: Vital signs in last 24 hours: Temp:  [98 F (36.7 C)-99.2 F (37.3 C)] 98.2 F (36.8 C) (07/21 0550) Pulse Rate:  [58-88] 66 (07/21 0550) Resp:  [16-20] 16 (07/21 0550) BP: (107-133)/(54-79) 107/61 mmHg (07/21 0550) SpO2:  [95 %-100 %] 98 % (07/21 0550) Weight:  [78.019 kg (172 lb)] 78.019 kg (172 lb) (07/20 1017) Last BM Date: 09/16/12 Afebrile, VSS Labs better  Intake/Output from previous day: 07/20 0701 - 07/21 0700 In: 1650 [I.V.:1350; IV Piggyback:300] Out: 1050 [Urine:1050] Intake/Output this shift:    General appearance: alert, cooperative and no distress Skin: Wound is clean and pretty deep.  I will get some dressing gauze and repack, teach his family how to do it also.    Lab Results:   Recent Labs  09/17/12 1055 09/18/12 0545  WBC 13.0* 10.6*  HGB 14.7 14.2  HCT 41.8 42.7  PLT 314 329    BMET  Recent Labs  09/17/12 1055 09/18/12 0545  NA 137 140  K 3.3* 3.7  CL 99 99  CO2 28 32  GLUCOSE 104* 104*  BUN 9 7  CREATININE 0.70 0.69  CALCIUM 9.8 9.6   PT/INR No results found for this basename: LABPROT, INR,  in the last 72 hours  No results found for this basename: AST, ALT, ALKPHOS, BILITOT, PROT, ALBUMIN,  in the last 168 hours   Lipase  No results found for this basename: lipase     Studies/Results: Ct Abdomen Pelvis W Contrast  09/17/2012   *RADIOLOGY REPORT*  Clinical Data: Evaluate for abscess.  CT ABDOMEN AND PELVIS WITH CONTRAST  Technique:  Multidetector CT imaging of the abdomen and pelvis was performed following the standard protocol during bolus administration of intravenous contrast.  Contrast: 25mL OMNIPAQUE IOHEXOL 300 MG/ML  SOLN, OMNIPAQUE IOHEXOL 300 MG/ML  SOLN  Comparison: None.  Findings:  There is no pleural effusion identified.  The lung bases appear clear.  No focal liver abnormality.  The gallbladder is  normal.  No biliary dilatation.  Normal appearance of the pancreas.  The spleen is normal.  The adrenal glands are both within normal limits.  Right kidney is normal.  The left kidney is normal.  The urinary bladder is unremarkable.  Normal caliber of the abdominal aorta.  No upper abdominal adenopathy.  No pelvic or inguinal adenopathy identified.  The stomach appears normal.  The small bowel loops are unremarkable.  The appendix is visualized and appears normal. Normal caliber of the colon.  There is a peripherally enhancing, low attenuation structure within the left side, perirectal soft tissues.  This measures 3.8 x 1.1 x 3.0 cm.  Review of the visualized osseous structures was unremarkable.  IMPRESSION:  1.  Exam positive for perirectal fluid collection compatible with abscess. 2.  No intra-abdominal or pelvic abnormalities.   Original Report Authenticated By: Signa Kell, M.D.    Medications: . ciprofloxacin  400 mg Intravenous Q12H  . heparin  5,000 Units Subcutaneous Q8H  . metronidazole  500 mg Intravenous Q8H  . pantoprazole  40 mg Oral Daily    Assessment/Plan Large Left perirectal abscess S/p  IRRIGATION AND DEBRIDEMENT PERIRECTAL ABSCESS, 09/17/2012, Liz Malady, MD.     Plan:  Teach family how to do dressing change and home after that. Change Cipro and flagyl to PO form.  LOS: 1 day  [name] [name] 09/18/2012

## 2012-09-18 NOTE — Progress Notes (Signed)
I have seen and examined the patient and agree with the assessment and plans.  Shina Wass A. Haniyah Maciolek  MD, FACS  

## 2012-09-18 NOTE — Progress Notes (Signed)
Patient discharged to home with instructions and verbalized understanding. 

## 2012-09-21 LAB — CULTURE, ROUTINE-ABSCESS

## 2012-09-24 LAB — ANAEROBIC CULTURE

## 2012-10-02 ENCOUNTER — Encounter (INDEPENDENT_AMBULATORY_CARE_PROVIDER_SITE_OTHER): Payer: Self-pay

## 2012-10-02 ENCOUNTER — Ambulatory Visit (INDEPENDENT_AMBULATORY_CARE_PROVIDER_SITE_OTHER): Payer: Self-pay | Admitting: General Surgery

## 2012-10-02 ENCOUNTER — Encounter (INDEPENDENT_AMBULATORY_CARE_PROVIDER_SITE_OTHER): Payer: Self-pay | Admitting: General Surgery

## 2012-10-02 VITALS — BP 120/84 | HR 72 | Temp 97.7°F | Resp 14 | Ht 65.0 in | Wt 164.6 lb

## 2012-10-02 DIAGNOSIS — K612 Anorectal abscess: Secondary | ICD-10-CM

## 2012-10-02 DIAGNOSIS — K611 Rectal abscess: Secondary | ICD-10-CM

## 2012-10-02 NOTE — Progress Notes (Signed)
Subjective:     Patient ID: [name] [name], male   DOB: [birth year], 38 y.o.   MRN: [id number]  HPI Patient status post incision and drainage of perirectal abscess. His wife has been helping him with dressing changes. She claims the wound has gotten much more shallow.  Review of Systems     Objective:   Physical Exam Wound has completely clean granulation tissue with no residual induration, no cellulitis, the wound is very shallow    Assessment:     , Status post incision and drainage of perirectal abscess    Plan:     A return to work on August fifth, 2014. Cover with dry gauze until healed.

## 2012-10-07 NOTE — ED Provider Notes (Signed)
Medical screening examination/treatment/procedure(s) were performed by non-physician practitioner and as supervising physician I was immediately available for consultation/collaboration.    Vida Roller, MD 10/07/12 [phone]

## 2013-12-21 ENCOUNTER — Emergency Department (HOSPITAL_COMMUNITY): Payer: Self-pay

## 2013-12-21 ENCOUNTER — Emergency Department (HOSPITAL_COMMUNITY)
Admission: EM | Admit: 2013-12-21 | Discharge: 2013-12-21 | Disposition: A | Discharge status: Home / Self Care | Payer: Self-pay | Attending: Emergency Medicine | Admitting: Emergency Medicine

## 2013-12-21 ENCOUNTER — Encounter (HOSPITAL_COMMUNITY): Payer: Self-pay | Admitting: Emergency Medicine

## 2013-12-21 DIAGNOSIS — R0602 Shortness of breath: Secondary | ICD-10-CM | POA: Insufficient documentation

## 2013-12-21 DIAGNOSIS — R519 Headache, unspecified: Secondary | ICD-10-CM

## 2013-12-21 DIAGNOSIS — R51 Headache: Secondary | ICD-10-CM | POA: Insufficient documentation

## 2013-12-21 DIAGNOSIS — J0101 Acute recurrent maxillary sinusitis: Secondary | ICD-10-CM | POA: Insufficient documentation

## 2013-12-21 DIAGNOSIS — F1021 Alcohol dependence, in remission: Secondary | ICD-10-CM | POA: Insufficient documentation

## 2013-12-21 DIAGNOSIS — J0100 Acute maxillary sinusitis, unspecified: Secondary | ICD-10-CM

## 2013-12-21 LAB — COMPREHENSIVE METABOLIC PANEL
ALT: 39 U/L (ref 0–53)
AST: 25 U/L (ref 0–37)
Albumin: 4.3 g/dL (ref 3.5–5.2)
Alkaline Phosphatase: 66 U/L (ref 39–117)
Anion gap: 14 (ref 5–15)
BUN: 11 mg/dL (ref 6–23)
CALCIUM: 9.6 mg/dL (ref 8.4–10.5)
CO2: 25 meq/L (ref 19–32)
CREATININE: 0.77 mg/dL (ref 0.50–1.35)
Chloride: 102 mEq/L (ref 96–112)
GLUCOSE: 111 mg/dL — AB (ref 70–99)
Potassium: 3.8 mEq/L (ref 3.7–5.3)
SODIUM: 141 meq/L (ref 137–147)
TOTAL PROTEIN: 8.1 g/dL (ref 6.0–8.3)
Total Bilirubin: 0.4 mg/dL (ref 0.3–1.2)

## 2013-12-21 LAB — CBC WITH DIFFERENTIAL/PLATELET
BASOS ABS: 0 10*3/uL (ref 0.0–0.1)
Basophils Relative: 1 % (ref 0–1)
EOS ABS: 0.2 10*3/uL (ref 0.0–0.7)
EOS PCT: 3 % (ref 0–5)
HEMATOCRIT: 47.8 % (ref 39.0–52.0)
Hemoglobin: 16.4 g/dL (ref 13.0–17.0)
LYMPHS ABS: 1.9 10*3/uL (ref 0.7–4.0)
LYMPHS PCT: 31 % (ref 12–46)
MCH: 30.4 pg (ref 26.0–34.0)
MCHC: 34.3 g/dL (ref 30.0–36.0)
MCV: 88.7 fL (ref 78.0–100.0)
MONO ABS: 0.3 10*3/uL (ref 0.1–1.0)
Monocytes Relative: 5 % (ref 3–12)
Neutro Abs: 3.6 10*3/uL (ref 1.7–7.7)
Neutrophils Relative %: 60 % (ref 43–77)
PLATELETS: 278 10*3/uL (ref 150–400)
RBC: 5.39 MIL/uL (ref 4.22–5.81)
RDW: 12.2 % (ref 11.5–15.5)
WBC: 6 10*3/uL (ref 4.0–10.5)

## 2013-12-21 LAB — I-STAT TROPONIN, ED: TROPONIN I, POC: 0 ng/mL (ref 0.00–0.08)

## 2013-12-21 MED ORDER — AMOXICILLIN-POT CLAVULANATE 875-125 MG PO TABS: 1.0000 | ORAL_TABLET | Freq: Two times a day (BID) | ORAL | Status: DC

## 2013-12-21 MED ORDER — ONDANSETRON HCL 4 MG/2ML IJ SOLN
4.0000 mg | Freq: Once | INTRAMUSCULAR | Status: AC
  Administered 2013-12-21: 4 mg via INTRAVENOUS
  Filled 2013-12-21: qty 2

## 2013-12-21 MED ORDER — MORPHINE SULFATE 4 MG/ML IJ SOLN
4.0000 mg | Freq: Once | INTRAMUSCULAR | Status: AC
  Administered 2013-12-21: 4 mg via INTRAVENOUS
  Filled 2013-12-21: qty 1

## 2013-12-21 MED ORDER — AMOXICILLIN-POT CLAVULANATE 875-125 MG PO TABS
1.0000 | ORAL_TABLET | Freq: Once | ORAL | Status: AC
  Administered 2013-12-21: 1 via ORAL
  Filled 2013-12-21: qty 1

## 2013-12-21 MED ORDER — HYDROCODONE-ACETAMINOPHEN 5-325 MG PO TABS: 1.0000 | ORAL_TABLET | Freq: Four times a day (QID) | ORAL | Status: DC | PRN

## 2013-12-21 NOTE — Discharge Instructions (Signed)
Sinusitis °Sinusitis is redness, soreness, and inflammation of the paranasal sinuses. Paranasal sinuses are air pockets within the bones of your face (beneath the eyes, the middle of the forehead, or above the eyes). In healthy paranasal sinuses, mucus is able to drain out, and air is able to circulate through them by way of your nose. However, when your paranasal sinuses are inflamed, mucus and air can become trapped. This can allow bacteria and other germs to grow and cause infection. °Sinusitis can develop quickly and last only a short time (acute) or continue over a long period (chronic). Sinusitis that lasts for more than 12 weeks is considered chronic.  °CAUSES  °Causes of sinusitis include: °· Allergies. °· Structural abnormalities, such as displacement of the cartilage that separates your nostrils (deviated septum), which can decrease the air flow through your nose and sinuses and affect sinus drainage. °· Functional abnormalities, such as when the small hairs (cilia) that line your sinuses and help remove mucus do not work properly or are not present. °SIGNS AND SYMPTOMS  °Symptoms of acute and chronic sinusitis are the same. The primary symptoms are pain and pressure around the affected sinuses. Other symptoms include: °· Upper toothache. °· Earache. °· Headache. °· Bad breath. °· Decreased sense of smell and taste. °· A cough, which worsens when you are lying flat. °· Fatigue. °· Fever. °· Thick drainage from your nose, which often is green and may contain pus (purulent). °· Swelling and warmth over the affected sinuses. °DIAGNOSIS  °Your health care provider will perform a physical exam. During the exam, your health care provider may: °· Look in your nose for signs of abnormal growths in your nostrils (nasal polyps). °· Tap over the affected sinus to check for signs of infection. °· View the inside of your sinuses (endoscopy) using an imaging device that has a light attached (endoscope). °If your health  care provider suspects that you have chronic sinusitis, one or more of the following tests may be recommended: °· Allergy tests. °· Nasal culture. A sample of mucus is taken from your nose, sent to a lab, and screened for bacteria. °· Nasal cytology. A sample of mucus is taken from your nose and examined by your health care provider to determine if your sinusitis is related to an allergy. °TREATMENT  °Most cases of acute sinusitis are related to a viral infection and will resolve on their own within 10 days. Sometimes medicines are prescribed to help relieve symptoms (pain medicine, decongestants, nasal steroid sprays, or saline sprays).  °However, for sinusitis related to a bacterial infection, your health care provider will prescribe antibiotic medicines. These are medicines that will help kill the bacteria causing the infection.  °Rarely, sinusitis is caused by a fungal infection. In theses cases, your health care provider will prescribe antifungal medicine. °For some cases of chronic sinusitis, surgery is needed. Generally, these are cases in which sinusitis recurs more than 3 times per year, despite other treatments. °HOME CARE INSTRUCTIONS  °· Drink plenty of water. Water helps thin the mucus so your sinuses can drain more easily. °· Use a humidifier. °· Inhale steam 3 to 4 times a day (for example, sit in the bathroom with the shower running). °· Apply a warm, moist washcloth to your face 3 to 4 times a day, or as directed by your health care provider. °· Use saline nasal sprays to help moisten and clean your sinuses. °· Take medicines only as directed by your health care provider. °·   If you were prescribed either an antibiotic or antifungal medicine, finish it all even if you start to feel better. SEEK IMMEDIATE MEDICAL CARE IF:  You have increasing pain or severe headaches.  You have nausea, vomiting, or drowsiness.  You have swelling around your face.  You have vision problems.  You have a stiff  neck.  You have difficulty breathing. MAKE SURE YOU:   Understand these instructions.  Will watch your condition.  Will get help right away if you are not doing well or get worse. Document Released: 02/15/2005 Document Revised: 07/02/2013 Document Reviewed: 03/02/2011 [facility] Patient Information 2015 [place], [place]. This information is not intended to replace advice given to you by your health care provider. Make sure you discuss any questions you have with your health care provider.  Sinus Headache A sinus headache happens when your sinuses become clogged or puffy (swollen). Sinus headaches can be mild or severe. HOME CARE  Take your medicines (antibiotics) as told. Finish them even if you start to feel better.  Only take medicine as told by your doctor.  Use a nose spray if you feel stuffed up (congested). GET HELP RIGHT AWAY IF:  You have a fever.  You have trouble seeing.  You suddenly have pain in your face or head.  You start to twitch or shake (seizure).  You are confused.  You get headaches more than once a week.  Light or sound bothers you.  You feel sick to your stomach (nauseous) or throw up (vomit).  Your headaches do not get better with treatment. MAKE SURE YOU:  Understand these instructions.  Will watch your condition.  Will get help right away if you are not doing well or get worse. Document Released: 06/17/2010 Document Revised: 05/10/2011 Document Reviewed: 06/17/2010 [facility] Patient Information 2015 [place], [place]. This information is not intended to replace advice given to you by your health care provider. Make sure you discuss any questions you have with your health care provider.

## 2013-12-21 NOTE — ED Notes (Signed)
Pt in from home c/o HA posterior onset x 2 wks with intermittent dizziness, pt denies slurred speech, gait changes, & blurred vision, pt describes pain as a pressure in his head, pt A&O x4, follows commands, speaks in complete sentences

## 2013-12-21 NOTE — ED Provider Notes (Signed)
CSN: [id number]     Arrival date & time 12/21/13  0845 History   First MD Initiated Contact with Patient 12/21/13 [phone]     Chief Complaint  Patient presents with  . Headache     (Consider location/radiation/quality/duration/timing/severity/associated sxs/prior Treatment) HPI Comments: Patient is a 38 year old male who presents the emergency department for 2 weeks of gradually worsening headache. He reports his headache is in the front and back of his head. The headache is worse with movement. He describes the pain as a pressure. He has associated intermittent lightheadedness. He denies taking any medications to improve his pain. He reports that last night he had an episode of shortness of breath, but states he feels that this is due to being nervous about the headache. He denies any visual disturbance, photophobia, phonophobia, slurred speech, ataxic gait, numbness, weakness.  Patient is a 38 y.o. male presenting with headaches. The history is provided by the patient. No language interpreter was used.  Headache Associated symptoms: no abdominal pain, no cough, no fever, no nausea, no numbness and no vomiting     History reviewed. No pertinent past medical history. Past Surgical History  Procedure Laterality Date  . Incision and drainage perirectal abscess N/A 09/17/2012    Procedure: IRRIGATION AND DEBRIDEMENT PERIRECTAL ABSCESS;  Surgeon: Liz MaladyBurke E Thompson, MD;  [facility];  Service: General;  Laterality: N/A;   No family history on file. History  Substance Use Topics  . Smoking status: Never Smoker   . Smokeless tobacco: Never Used  . Alcohol Use: Yes     Comment: weekends    Review of Systems  Constitutional: Negative for fever and chills.  HENT: Positive for rhinorrhea.   Respiratory: Positive for shortness of breath. Negative for cough.   Cardiovascular: Negative for chest pain.  Gastrointestinal: Negative for nausea, vomiting and abdominal pain.  Neurological: Positive  for light-headedness and headaches. Negative for weakness and numbness.  All other systems reviewed and are negative.     Allergies  Review of patient's allergies indicates no known allergies.  Home Medications   Prior to Admission medications   Not on File   BP 124/84  Pulse 71  Temp(Src) 98.3 F (36.8 C) (Oral)  Resp 20  Ht 5\' 5"  (1.651 m)  Wt 175 lb (79.379 kg)  BMI 29.12 kg/m2  SpO2 100% Physical Exam  Nursing note and vitals reviewed. Constitutional: He is oriented to person, place, and time. He appears well-developed and well-nourished. No distress.  Well appearing  HENT:  Head: Normocephalic and atraumatic.  Right Ear: Tympanic membrane, external ear and ear canal normal.  Left Ear: Tympanic membrane, external ear and ear canal normal.  Nose: Right sinus exhibits maxillary sinus tenderness. Right sinus exhibits no frontal sinus tenderness. Left sinus exhibits maxillary sinus tenderness. Left sinus exhibits no frontal sinus tenderness.  Mouth/Throat: Uvula is midline, oropharynx is clear and moist and mucous membranes are normal.  No temporal artery tenderness  Eyes: Conjunctivae and EOM are normal. Pupils are equal, round, and reactive to light.  Neck: Normal range of motion. No tracheal deviation present.  No nuchal rigidity or meningeal signs  Cardiovascular: Normal rate, regular rhythm, normal heart sounds, intact distal pulses and normal pulses.   Pulses:      Radial pulses are 2+ on the right side, and 2+ on the left side.       Posterior tibial pulses are 2+ on the right side, and 2+ on the left side.  Pulmonary/Chest: Effort normal  and breath sounds normal. No stridor.  Abdominal: Soft. He exhibits no distension. There is no tenderness.  Musculoskeletal: Normal range of motion.  Neurological: He is alert and oriented to person, place, and time. He has normal strength. Coordination and gait normal. GCS eye subscore is 4. GCS verbal subscore is 5. GCS motor  subscore is 6.  Finger-nose-finger normal, rapid alternating movements normal, heel knee shin normal. Grip strength 5 out of 5 bilaterally Strength 5/5 in all extremities.   Skin: Skin is warm and dry. He is not diaphoretic.  Psychiatric: He has a normal mood and affect. His behavior is normal.    ED Course  Procedures (including critical care time) Labs Review Labs Reviewed  COMPREHENSIVE METABOLIC PANEL - Abnormal; Notable for the following:    Glucose, Bld 111 (*)    All other components within normal limits  CBC WITH DIFFERENTIAL  I-STAT TROPOININ, ED    Imaging Review Ct Head Wo Contrast  12/21/2013   CLINICAL DATA:  Headache x2 weeks.  Dizziness.  EXAM: CT HEAD WITHOUT CONTRAST  TECHNIQUE: Contiguous axial images were obtained from the base of the skull through the vertex without intravenous contrast.  COMPARISON:  None.  FINDINGS: No intra-axial or extra-axial pathologic fluid or blood collection. No mass. No hydrocephalus. No acute bony abnormality. Mild mucosal thickening maxillary sinuses.  IMPRESSION: No acute intracranial abnormality. Mild mucosal thickening in the maxillary sinuses.   Electronically Signed   By: Maisie Fushomas  Register   On: 12/21/2013 09:49     EKG Interpretation None      MDM   Final diagnoses:  Headache  Acute maxillary sinusitis, recurrence not specified   Patient complaining of symptoms of sinusitis.  Symptoms have been present for greater than 10 days.  Concern for acute bacterial rhinosinusitis.  Patient discharged with Augmentin. Patient is generally well appearing, afebrile, normotensive. He is feeling better after fluids and morphine. Instructions given for warm saline nasal wash and recommendations for follow-up with primary care physician.  Discussed reasons to return to ED immediately. Vital signs stable for discharge. Patient / Family / Caregiver informed of clinical course, understand medical decision-making process, and agree with  plan.    Mora BellmanHannah S Chanci Ojala, PA-C 12/21/13 1110

## 2013-12-21 NOTE — Discharge Planning (Signed)
[facility] Community Health & Eligibility Specialist ° °Spoke with patient regarding primary care resources and the [facility] orange card. Orange card application and instructions provided. Resource guide and my contact information also given for any future questions or concerns. No other Community Health & Eligibility Specialist needs identified at this time. ° °

## 2013-12-21 NOTE — ED Notes (Signed)
PT ambulated with baseline gait; VSS; A&Ox3; no signs of distress; respirations even and unlabored; skin warm and dry; no questions upon discharge.  

## 2013-12-25 NOTE — ED Provider Notes (Signed)
Medical screening examination/treatment/procedure(s) were performed by non-physician practitioner and as supervising physician I was immediately available for consultation/collaboration.   EKG Interpretation   Date/Time:  Friday December 21 2013 09:15:18 EDT Ventricular Rate:  71 PR Interval:  137 QRS Duration: 104 QT Interval:  382 QTC Calculation: 415 R Axis:   -40 Text Interpretation:  Sinus rhythm Left axis deviation ED PHYSICIAN  INTERPRETATION AVAILABLE IN [facility] Confirmed by TEST, Record  ([id number]) on 12/23/2013 12:27:38 PM        Mirian MoMatthew Gentry, MD 12/25/13 [id number]

## 2014-01-28 ENCOUNTER — Ambulatory Visit (INDEPENDENT_AMBULATORY_CARE_PROVIDER_SITE_OTHER): Payer: Self-pay | Admitting: Emergency Medicine

## 2014-01-28 ENCOUNTER — Ambulatory Visit (INDEPENDENT_AMBULATORY_CARE_PROVIDER_SITE_OTHER): Payer: Self-pay

## 2014-01-28 VITALS — BP 118/80 | HR 90 | Temp 98.9°F | Resp 18 | Ht 65.0 in | Wt 175.0 lb

## 2014-01-28 DIAGNOSIS — M545 Low back pain: Secondary | ICD-10-CM

## 2014-01-28 MED ORDER — PREDNISONE 10 MG PO TABS: ORAL_TABLET | ORAL | Status: DC

## 2014-01-28 MED ORDER — HYDROCODONE-ACETAMINOPHEN 5-325 MG PO TABS: ORAL_TABLET | ORAL | Status: DC

## 2014-01-28 NOTE — Patient Instructions (Signed)
[name] [name]  [name] [phone]  Lumbosacral Strain Lumbosacral strain is a strain of any of the parts that make up your lumbosacral vertebrae. Your lumbosacral vertebrae are the bones that make up the lower third of your backbone. Your lumbosacral vertebrae are held together by muscles and tough, fibrous tissue (ligaments).  CAUSES  A sudden blow to your back can cause lumbosacral strain. Also, anything that causes an excessive stretch of the muscles in the low back can cause this strain. This is typically seen when people exert themselves strenuously, fall, lift heavy objects, bend, or crouch repeatedly. RISK FACTORS  Physically demanding work.  Participation in pushing or pulling sports or sports that require a sudden twist of the back (tennis, golf, baseball).  Weight lifting.  Excessive lower back curvature.  Forward-tilted pelvis.  Weak back or abdominal muscles or both.  Tight hamstrings. SIGNS AND SYMPTOMS  Lumbosacral strain may cause pain in the area of your injury or pain that moves (radiates) down your leg.  DIAGNOSIS Your health care provider can often diagnose lumbosacral strain through a physical exam. In some cases, you may need tests such as X-ray exams.  TREATMENT  Treatment for your lower back injury depends on many factors that your clinician will have to evaluate. However, most treatment will include the use of anti-inflammatory medicines. HOME CARE INSTRUCTIONS   Avoid hard physical activities (tennis, racquetball, waterskiing) if you are not in proper physical condition for it. This may aggravate or create problems.  If you have a back problem, avoid sports requiring sudden body movements. Swimming and walking are generally safer activities.  Maintain good posture.  Maintain a healthy weight.  For acute conditions, you may put ice on the injured area.  Put ice in a plastic bag.  Place a towel between your skin and the bag.  Leave the  ice on for 20 minutes, 2-3 times a day.  When the low back starts healing, stretching and strengthening exercises may be recommended. SEEK MEDICAL CARE IF:  Your back pain is getting worse.  You experience severe back pain not relieved with medicines. SEEK IMMEDIATE MEDICAL CARE IF:   You have numbness, tingling, weakness, or problems with the use of your arms or legs.  There is a change in bowel or bladder control.  You have increasing pain in any area of the body, including your belly (abdomen).  You notice shortness of breath, dizziness, or feel faint.  You feel sick to your stomach (nauseous), are throwing up (vomiting), or become sweaty.  You notice discoloration of your toes or legs, or your feet get very cold. MAKE SURE YOU:   Understand these instructions.  Will watch your condition.  Will get help right away if you are not doing well or get worse. Document Released: 11/25/2004 Document Revised: 02/20/2013 Document Reviewed: 10/04/2012 [facility] Patient Information 2015 [place], [place]LL[place]. This information is not intended to replace advice given to you by your health care provider. Make sure you discuss any questions you have with your health care provider.

## 2014-01-28 NOTE — Progress Notes (Deleted)
Subjective:  This chart was scribed for [name] ChrisSteven Daub, MD by [name], Medical Scribe. This patient was seen in Room 11 and the patient's care was started at 11:57 AM.   [facility]: [facility] Janae SauceCervantes [name], male    DOB: 01-27-1976, 38 y.o.   MRN: [id number]  HPI HPI Comments: [facility] SauceCervantes [name] is a 38 y.o. male who presents to the [facility] complaining of constant, non-radiating, lower back pain that started last week while moving materials at work.  He states that he experienced these symptoms 3 years ago and was told that he had swelling in his discs.  He was treated with steroids and experienced complete relief to his symptoms.  He has been taking his girlfriend's Naproxen and Flexeril with mild relief to his symptoms.  He denies leg pain and bowel or bladder incontinence as associated symptoms.     Patient Active Problem List   Diagnosis Date Noted   Perirectal abscess 10/02/2012   History reviewed. No pertinent past medical history. Past Surgical History  Procedure Laterality Date   Incision and drainage perirectal abscess N/A 09/17/2012    Procedure: IRRIGATION AND DEBRIDEMENT PERIRECTAL ABSCESS;  Surgeon: Liz MaladyBurke E Thompson, MD;  Location: [facility] OR;  Service: General;  Laterality: N/A;   No Known Allergies Prior to Admission medications   Medication Sig Start Date End Date Taking? Authorizing Provider  cyclobenzaprine (FLEXERIL) 10 MG tablet Take 10 mg by mouth 3 (three) times daily as needed for muscle spasms.   Yes Historical Provider, MD  naproxen (NAPROSYN) 500 MG tablet Take 500 mg by mouth 2 (two) times daily with a meal.   Yes Historical Provider, MD  amoxicillin-clavulanate (AUGMENTIN) 875-125 MG per tablet Take 1 tablet by mouth 2 (two) times daily. One po bid x 7 days Patient not taking: Reported on 01/28/2014 12/21/13   Mora BellmanHannah S Merrell, PA-C  HYDROcodone-acetaminophen (NORCO/VICODIN) 5-325 MG per tablet Take 1 tablet by mouth every 6  (six) hours as needed for moderate pain or severe pain. Patient not taking: Reported on 01/28/2014 12/21/13   Mora BellmanHannah S Merrell, PA-C   History   Social History   Marital Status: Legally Separated    Spouse Name: N/A    Number of Children: N/A   Years of Education: N/A   Occupational History   Not on file.   Social History Main Topics   Smoking status: Never Smoker    Smokeless tobacco: Never Used   Alcohol Use: 16.8 oz/week    28 Not specified per week     Comment: weekends   Drug Use: No   Sexual Activity: Not on file   Other Topics Concern   Not on file   Social History Narrative     Review of Systems  Gastrointestinal: Negative for diarrhea and constipation.  Endocrine: Negative for polyuria.  Genitourinary: Negative for dysuria, urgency, frequency, hematuria, decreased urine volume, enuresis and difficulty urinating.  Musculoskeletal: Positive for back pain. Negative for arthralgias.     Objective:  Physical Exam  Constitutional: He is oriented to person, place, and time. He appears well-developed and well-nourished.  HENT:  Head: Normocephalic and atraumatic.  Eyes: EOM are normal.  Neck: Normal range of motion.  Cardiovascular: Normal rate.   Pulmonary/Chest: Effort normal.  Musculoskeletal: Normal range of motion. He exhibits tenderness.  Mild tenderness over the lower lumbar spine.  Straight leg raise is negative and motor strength is 5/5.   Neurological: He is alert and oriented  to person, place, and time.  Skin: Skin is warm and dry.  Psychiatric: He has a normal mood and affect. His behavior is normal.  Nursing note and vitals reviewed.  [facility] reading (PRIMARY) by  Dr.Daub there is calcification over the L4 facet which appears to be a metallic foreign body.   BP 118/80 mmHg   Pulse 90   Temp(Src) 98.9 F (37.2 C) (Oral)   Resp 18   Ht 5\' 5"  (1.651 m)   Wt 175 lb (79.379 kg)   BMI 29.12 kg/m2   SpO2 100% Assessment & Plan:  Will treat with  a prednisone taper. Hydrocodone for pain recheck on Friday if not better. He was was also given the number for Dr. Mayford KnifeWilliams chiropractor..Marland Kitchen

## 2014-01-28 NOTE — Progress Notes (Signed)
Subjective:  This chart was scribed for [name] ChrisSteven Ethylene Reznick, MD by [name] [name], Medical Scribe. This patient was seen in Room 11 and the patient's care was started at 11:57 AM.   Patient ID: [name] [name], male    DOB: 05/21/1975, 38 y.o.   MRN: [id number]  Back Pain Pertinent negatives include no dysuria.   HPI Comments: [name] [name] is a 38 y.o. male who presents to the [facility] complaining of constant, non-radiating, lower back pain that started last week while moving materials at work.  He states that he experienced these symptoms 3 years ago and was told that he had swelling in his discs.  He was treated with steroids and experienced complete relief to his symptoms.  He has been taking his girlfriend's Naproxen and Flexeril with mild relief to his symptoms.  He denies leg pain and bowel or bladder incontinence as associated symptoms.     Patient Active Problem List   Diagnosis Date Noted  . Perirectal abscess 10/02/2012   History reviewed. No pertinent past medical history. Past Surgical History  Procedure Laterality Date  . Incision and drainage perirectal abscess N/A 09/17/2012    Procedure: IRRIGATION AND DEBRIDEMENT PERIRECTAL ABSCESS;  Surgeon: [name] MaladyBurke E Thompson, MD;  Location: [facility] OR;  Service: General;  Laterality: N/A;   No Known Allergies Prior to Admission medications   Medication Sig Start Date End Date Taking? Authorizing Provider  cyclobenzaprine (FLEXERIL) 10 MG tablet Take 10 mg by mouth 3 (three) times daily as needed for muscle spasms.   Yes Historical Provider, MD  naproxen (NAPROSYN) 500 MG tablet Take 500 mg by mouth 2 (two) times daily with a meal.   Yes Historical Provider, MD  amoxicillin-clavulanate (AUGMENTIN) 875-125 MG per tablet Take 1 tablet by mouth 2 (two) times daily. One po bid x 7 days Patient not taking: Reported on 01/28/2014 12/21/13   [name] BellmanHannah S Merrell, PA-C  HYDROcodone-acetaminophen (NORCO/VICODIN)  5-325 MG per tablet Take 1 tablet by mouth every 6 (six) hours as needed for moderate pain or severe pain. Patient not taking: Reported on 01/28/2014 12/21/13   [name] BellmanHannah S Merrell, PA-C   History   Social History  . Marital Status: Legally Separated    Spouse Name: N/A    Number of Children: N/A  . Years of Education: N/A   Occupational History  . Not on file.   Social History Main Topics  . Smoking status: Never Smoker   . Smokeless tobacco: Never Used  . Alcohol Use: 16.8 oz/week    28 Not specified per week     Comment: weekends  . Drug Use: No  . Sexual Activity: Not on file   Other Topics Concern  . Not on file   Social History Narrative     Review of Systems  Gastrointestinal: Negative for diarrhea and constipation.  Endocrine: Negative for polyuria.  Genitourinary: Negative for dysuria, urgency, frequency, hematuria, decreased urine volume, enuresis and difficulty urinating.  Musculoskeletal: Positive for back pain. Negative for arthralgias.     Objective:  Physical Exam  Constitutional: He is oriented to person, place, and time. He appears well-developed and well-nourished.  HENT:  Head: Normocephalic and atraumatic.  Eyes: EOM are normal.  Neck: Normal range of motion.  Cardiovascular: Normal rate.   Pulmonary/Chest: Effort normal.  Musculoskeletal: Normal range of motion. He exhibits tenderness.  Mild tenderness over the lower lumbar spine.  Straight leg raise is negative and motor strength is 5/5.  Neurological: He is alert and oriented to person, place, and time.  Skin: Skin is warm and dry.  Psychiatric: He has a normal mood and affect. His behavior is normal.  Nursing note and vitals reviewed.  [facility] reading (PRIMARY) by  OmarRayma [name] there is calcification over the L4 facet which appears to be a metallic foreign body.   BP 118/80 mmHg  Pulse 90  Temp(Src) 98.9 F (37.2 C) (Oral)  Resp 18  Ht 5\' 5"  (1.651 m)  Wt 175 lb (79.379 kg)  BMI 29.12  kg/m2  SpO2 100% Assessment & Plan:  Will treat with a prednisone taper. Hydrocodone for pain recheck on Friday if not better. He was was also given the number for [name] [name] chiropractor..I personally performed the services described in this documentation, which was scribed in my presence. The recorded information has been reviewed and is accurate.

## 2014-09-17 ENCOUNTER — Emergency Department (HOSPITAL_BASED_OUTPATIENT_CLINIC_OR_DEPARTMENT_OTHER): Payer: Worker's Compensation | Admitting: Anesthesiology

## 2014-09-17 ENCOUNTER — Emergency Department (HOSPITAL_COMMUNITY)
Admission: RE | Admit: 2014-09-17 | Discharge: 2014-09-17 | Disposition: A | Discharge status: Home / Self Care | Payer: Worker's Compensation | Source: Ambulatory Visit | Attending: Emergency Medicine | Admitting: Emergency Medicine

## 2014-09-17 ENCOUNTER — Encounter (HOSPITAL_BASED_OUTPATIENT_CLINIC_OR_DEPARTMENT_OTHER): Admission: RE | Disposition: A | Discharge status: Home / Self Care | Payer: Self-pay | Source: Ambulatory Visit

## 2014-09-17 ENCOUNTER — Encounter (HOSPITAL_COMMUNITY): Payer: Self-pay | Admitting: Emergency Medicine

## 2014-09-17 ENCOUNTER — Emergency Department (HOSPITAL_COMMUNITY): Payer: Worker's Compensation

## 2014-09-17 ENCOUNTER — Ambulatory Visit (HOSPITAL_BASED_OUTPATIENT_CLINIC_OR_DEPARTMENT_OTHER): Admission: RE | Admit: 2014-09-17 | Payer: Self-pay | Source: Ambulatory Visit | Admitting: Orthopedic Surgery

## 2014-09-17 DIAGNOSIS — S9001XA Contusion of right ankle, initial encounter: Secondary | ICD-10-CM | POA: Insufficient documentation

## 2014-09-17 DIAGNOSIS — S61212A Laceration without foreign body of right middle finger without damage to nail, initial encounter: Secondary | ICD-10-CM | POA: Insufficient documentation

## 2014-09-17 DIAGNOSIS — Z791 Long term (current) use of non-steroidal anti-inflammatories (NSAID): Secondary | ICD-10-CM | POA: Insufficient documentation

## 2014-09-17 DIAGNOSIS — S61411A Laceration without foreign body of right hand, initial encounter: Secondary | ICD-10-CM

## 2014-09-17 DIAGNOSIS — Y990 Civilian activity done for income or pay: Secondary | ICD-10-CM | POA: Insufficient documentation

## 2014-09-17 DIAGNOSIS — S66122A Laceration of flexor muscle, fascia and tendon of right middle finger at wrist and hand level, initial encounter: Secondary | ICD-10-CM | POA: Insufficient documentation

## 2014-09-17 DIAGNOSIS — Y9289 Other specified places as the place of occurrence of the external cause: Secondary | ICD-10-CM | POA: Diagnosis not present

## 2014-09-17 DIAGNOSIS — S6991XA Unspecified injury of right wrist, hand and finger(s), initial encounter: Secondary | ICD-10-CM | POA: Diagnosis present

## 2014-09-17 DIAGNOSIS — Y9389 Activity, other specified: Secondary | ICD-10-CM | POA: Diagnosis not present

## 2014-09-17 DIAGNOSIS — Z23 Encounter for immunization: Secondary | ICD-10-CM | POA: Insufficient documentation

## 2014-09-17 DIAGNOSIS — S61214A Laceration without foreign body of right ring finger without damage to nail, initial encounter: Secondary | ICD-10-CM | POA: Diagnosis not present

## 2014-09-17 DIAGNOSIS — W208XXA Other cause of strike by thrown, projected or falling object, initial encounter: Secondary | ICD-10-CM | POA: Diagnosis not present

## 2014-09-17 HISTORY — PX: FLEXOR TENDON REPAIR: SHX6501

## 2014-09-17 LAB — POCT HEMOGLOBIN-HEMACUE: Hemoglobin: 15 g/dL (ref 13.0–17.0)

## 2014-09-17 SURGERY — REPAIR, TENDON, FLEXOR
Anesthesia: General | Site: Finger | Laterality: Right

## 2014-09-17 MED ORDER — LIDOCAINE HCL (PF) 2 % IJ SOLN
10.0000 mL | Freq: Once | INTRAMUSCULAR | Status: AC
  Administered 2014-09-17: 10 mL
  Filled 2014-09-17: qty 10

## 2014-09-17 MED ORDER — CEFAZOLIN SODIUM-DEXTROSE 2-3 GM-% IV SOLR
INTRAVENOUS | Status: AC
  Filled 2014-09-17: qty 50

## 2014-09-17 MED ORDER — GLYCOPYRROLATE 0.2 MG/ML IJ SOLN: 0.2000 mg | Freq: Once | INTRAMUSCULAR | Status: DC | PRN

## 2014-09-17 MED ORDER — DEXAMETHASONE SODIUM PHOSPHATE 10 MG/ML IJ SOLN
INTRAMUSCULAR | Status: DC | PRN
  Administered 2014-09-17: 10 mg via INTRAVENOUS

## 2014-09-17 MED ORDER — MIDAZOLAM HCL 2 MG/2ML IJ SOLN
1.0000 mg | INTRAMUSCULAR | Status: DC | PRN
  Administered 2014-09-17: 2 mg via INTRAVENOUS

## 2014-09-17 MED ORDER — MIDAZOLAM HCL 2 MG/2ML IJ SOLN
INTRAMUSCULAR | Status: AC
  Filled 2014-09-17: qty 2

## 2014-09-17 MED ORDER — HYDROCODONE-ACETAMINOPHEN 5-325 MG PO TABS: 1.0000 | ORAL_TABLET | Freq: Four times a day (QID) | ORAL | Status: DC | PRN

## 2014-09-17 MED ORDER — HYDROMORPHONE HCL 1 MG/ML IJ SOLN
1.0000 mg | Freq: Once | INTRAMUSCULAR | Status: AC
  Administered 2014-09-17: 1 mg via INTRAVENOUS
  Filled 2014-09-17: qty 1

## 2014-09-17 MED ORDER — ONDANSETRON HCL 4 MG/2ML IJ SOLN
4.0000 mg | Freq: Once | INTRAMUSCULAR | Status: AC
  Administered 2014-09-17: 4 mg via INTRAVENOUS
  Filled 2014-09-17: qty 2

## 2014-09-17 MED ORDER — CEFAZOLIN SODIUM 1-5 GM-% IV SOLN
1.0000 g | Freq: Once | INTRAVENOUS | Status: AC
  Administered 2014-09-17: 1 g via INTRAVENOUS
  Filled 2014-09-17: qty 50

## 2014-09-17 MED ORDER — BUPIVACAINE-EPINEPHRINE 0.5% -1:200000 IJ SOLN
INTRAMUSCULAR | Status: DC | PRN
  Administered 2014-09-17: 12 mL

## 2014-09-17 MED ORDER — LACTATED RINGERS IV SOLN
INTRAVENOUS | Status: DC
  Administered 2014-09-17 (×2): via INTRAVENOUS

## 2014-09-17 MED ORDER — LIDOCAINE HCL (CARDIAC) 20 MG/ML IV SOLN
INTRAVENOUS | Status: DC | PRN
  Administered 2014-09-17: 80 mg via INTRAVENOUS

## 2014-09-17 MED ORDER — PROMETHAZINE HCL 25 MG/ML IJ SOLN
6.2500 mg | INTRAMUSCULAR | Status: DC | PRN
Start: 2014-09-17 — End: 2014-09-17

## 2014-09-17 MED ORDER — TETANUS-DIPHTH-ACELL PERTUSSIS 5-2.5-18.5 LF-MCG/0.5 IM SUSP
0.5000 mL | Freq: Once | INTRAMUSCULAR | Status: AC
  Administered 2014-09-17: 0.5 mL via INTRAMUSCULAR
  Filled 2014-09-17: qty 0.5

## 2014-09-17 MED ORDER — PROPOFOL 10 MG/ML IV BOLUS
INTRAVENOUS | Status: DC | PRN
  Administered 2014-09-17: 200 mg via INTRAVENOUS
  Administered 2014-09-17 (×2): 30 mg via INTRAVENOUS

## 2014-09-17 MED ORDER — FENTANYL CITRATE (PF) 100 MCG/2ML IJ SOLN
50.0000 ug | INTRAMUSCULAR | Status: AC | PRN
  Administered 2014-09-17: 100 ug via INTRAVENOUS
  Administered 2014-09-17 (×3): 50 ug via INTRAVENOUS

## 2014-09-17 MED ORDER — AMOXICILLIN-POT CLAVULANATE 875-125 MG PO TABS: 1.0000 | ORAL_TABLET | Freq: Two times a day (BID) | ORAL | Status: DC

## 2014-09-17 MED ORDER — SCOPOLAMINE 1 MG/3DAYS TD PT72: 1.0000 | MEDICATED_PATCH | Freq: Once | TRANSDERMAL | Status: DC | PRN

## 2014-09-17 MED ORDER — ONDANSETRON HCL 4 MG/2ML IJ SOLN
INTRAMUSCULAR | Status: DC | PRN
  Administered 2014-09-17: 4 mg via INTRAVENOUS

## 2014-09-17 MED ORDER — CEFAZOLIN SODIUM-DEXTROSE 2-3 GM-% IV SOLR
INTRAVENOUS | Status: DC | PRN
  Administered 2014-09-17: 2 g via INTRAVENOUS

## 2014-09-17 MED ORDER — HYDROMORPHONE HCL 1 MG/ML IJ SOLN
0.2500 mg | INTRAMUSCULAR | Status: DC | PRN
  Administered 2014-09-17: 0.5 mg via INTRAVENOUS

## 2014-09-17 MED ORDER — HYDROMORPHONE HCL 1 MG/ML IJ SOLN
INTRAMUSCULAR | Status: AC
  Filled 2014-09-17: qty 1

## 2014-09-17 MED ORDER — FENTANYL CITRATE (PF) 100 MCG/2ML IJ SOLN
INTRAMUSCULAR | Status: AC
  Filled 2014-09-17: qty 6

## 2014-09-17 SURGICAL SUPPLY — 59 items
BLADE MINI RND TIP GREEN BEAV (BLADE) IMPLANT
BLADE SURG 15 STRL LF DISP TIS (BLADE) ×1 IMPLANT
BLADE SURG 15 STRL SS (BLADE) ×2
BNDG COHESIVE 4X5 TAN STRL (GAUZE/BANDAGES/DRESSINGS) ×3 IMPLANT
BNDG ESMARK 4X9 LF (GAUZE/BANDAGES/DRESSINGS) ×3 IMPLANT
BNDG GAUZE ELAST 4 BULKY (GAUZE/BANDAGES/DRESSINGS) ×6 IMPLANT
CHLORAPREP W/TINT 26ML (MISCELLANEOUS) ×3 IMPLANT
CORDS BIPOLAR (ELECTRODE) ×3 IMPLANT
COVER BACK TABLE 60X90IN (DRAPES) ×3 IMPLANT
COVER MAYO STAND STRL (DRAPES) ×3 IMPLANT
CUFF TOURNIQUET SINGLE 18IN (TOURNIQUET CUFF) ×3 IMPLANT
DECANTER SPIKE VIAL GLASS SM (MISCELLANEOUS) IMPLANT
DRAIN PENROSE 1/4X12 LTX STRL (WOUND CARE) IMPLANT
DRAPE EXTREMITY T 121X128X90 (DRAPE) ×3 IMPLANT
DRAPE SURG 17X23 STRL (DRAPES) ×3 IMPLANT
DRSG EMULSION OIL 3X3 NADH (GAUZE/BANDAGES/DRESSINGS) ×3 IMPLANT
ELECT REM PT RETURN 9FT ADLT (ELECTROSURGICAL)
ELECTRODE REM PT RTRN 9FT ADLT (ELECTROSURGICAL) IMPLANT
GAUZE SPONGE 4X4 12PLY STRL (GAUZE/BANDAGES/DRESSINGS) ×3 IMPLANT
GLOVE BIO SURGEON STRL SZ7 (GLOVE) ×6 IMPLANT
GLOVE BIO SURGEON STRL SZ7.5 (GLOVE) ×3 IMPLANT
GLOVE BIOGEL PI IND STRL 7.0 (GLOVE) ×1 IMPLANT
GLOVE BIOGEL PI IND STRL 8 (GLOVE) ×1 IMPLANT
GLOVE BIOGEL PI INDICATOR 7.0 (GLOVE) ×2
GLOVE BIOGEL PI INDICATOR 8 (GLOVE) ×2
GLOVE ECLIPSE 6.5 STRL STRAW (GLOVE) ×3 IMPLANT
GOWN STRL REUS W/ TWL XL LVL3 (GOWN DISPOSABLE) ×1 IMPLANT
GOWN STRL REUS W/TWL XL LVL3 (GOWN DISPOSABLE) ×5 IMPLANT
LOOP VESSEL MAXI BLUE (MISCELLANEOUS) IMPLANT
LOOP VESSEL MINI RED (MISCELLANEOUS) IMPLANT
NEEDLE HYPO 25X1 1.5 SAFETY (NEEDLE) ×3 IMPLANT
NS IRRIG 1000ML POUR BTL (IV SOLUTION) ×3 IMPLANT
PACK BASIN DAY SURGERY FS (CUSTOM PROCEDURE TRAY) ×3 IMPLANT
PADDING CAST ABS 4INX4YD NS (CAST SUPPLIES) ×2
PADDING CAST ABS COTTON 4X4 ST (CAST SUPPLIES) ×1 IMPLANT
PENCIL BUTTON HOLSTER BLD 10FT (ELECTRODE) IMPLANT
RUBBERBAND STERILE (MISCELLANEOUS) ×3 IMPLANT
SLEEVE SCD COMPRESS KNEE MED (MISCELLANEOUS) IMPLANT
SLING ARM LRG ADULT FOAM STRAP (SOFTGOODS) ×3 IMPLANT
SPLINT PLASTER CAST XFAST 3X15 (CAST SUPPLIES) ×7 IMPLANT
SPLINT PLASTER XTRA FASTSET 3X (CAST SUPPLIES) ×14
STOCKINETTE 6  STRL (DRAPES) ×2
STOCKINETTE 6 STRL (DRAPES) ×1 IMPLANT
SUT FIBERWIRE 2-0 18 17.9 3/8 (SUTURE)
SUT MERSILENE 4 0 P 3 (SUTURE) IMPLANT
SUT POLY BUTTON 15MM (SUTURE) IMPLANT
SUT PROLENE 6 0 P 1 18 (SUTURE) ×3 IMPLANT
SUT STEEL 4 (SUTURE) IMPLANT
SUT SUPRAMID 3-0 (SUTURE) ×9 IMPLANT
SUT VICRYL 3-0 CR8 SH (SUTURE) ×3 IMPLANT
SUT VICRYL RAPIDE 4-0 (SUTURE) IMPLANT
SUT VICRYL RAPIDE 4/0 PS 2 (SUTURE) ×6 IMPLANT
SUTURE FIBERWR 2-0 18 17.9 3/8 (SUTURE) IMPLANT
SYR BULB 3OZ (MISCELLANEOUS) ×3 IMPLANT
SYRINGE 10CC LL (SYRINGE) ×3 IMPLANT
TOWEL OR 17X24 6PK STRL BLUE (TOWEL DISPOSABLE) ×3 IMPLANT
TUBE CONNECTING 20'X1/4 (TUBING)
TUBE CONNECTING 20X1/4 (TUBING) IMPLANT
UNDERPAD 30X30 (UNDERPADS AND DIAPERS) IMPLANT

## 2014-09-17 NOTE — ED Notes (Signed)
Report given to jamie, Charity fundraiserN (nurse first) [facility]. [facility] eval by Dr. Janee Mornhompson hand surgeon.

## 2014-09-17 NOTE — Op Note (Signed)
09/17/2014  1:01 PM  PATIENT:  [name] [name]  39 y.o. male  PRE-OPERATIVE DIAGNOSIS:  Right long finger laceration with suspected flexor tendon injury and possible digital nerve injury  POST-OPERATIVE DIAGNOSIS:  Right long finger laceration with division of FDS & FDP  PROCEDURE:  Right long finger removal of sutures, debridement of open wound to include skin and subcutaneous tissues, & tendon; ulnar digital neuroplasty, excision of radial half of FDS, repair of ulnar half of FDS, repair of FDP, skin closure  SURGEON: Cliffton Asters. Janee Morn, MD  PHYSICIAN ASSISTANT: Danielle Rankin, OPA-C  ANESTHESIA:  general  SPECIMENS:  None  DRAINS:   None  EBL:  less than 50 mL  PREOPERATIVE INDICATIONS:  [name] [name] is a  39 y.o. male with right long finger injury resulting in an open wound that was closed in the emergency department. Examination reveals likely division of both flexor tendons in the process, possible injury to ulnar digital nerve as well.  The risks benefits and alternatives were discussed with the patient preoperatively including but not limited to the risks of infection, bleeding, nerve injury, cardiopulmonary complications, the need for revision surgery, among others, and the patient verbalized understanding and consented to proceed.  OPERATIVE IMPLANTS:  none   OPERATIVE PROCEDURE:  After receiving prophylactic antibiotics, the patient was escorted to the operative theatre and placed in a supine position.   General anesthesia was administered. A surgical "time-out" was performed during which the planned procedure, proposed operative site, and the correct patient identity were compared to the operative consent and agreement confirmed by the circulating nurse according to current facility policy.  Following application of a tourniquet to the operative extremity, the exposed skin was pre-scrub with a Hibiclens scrub brush before being formally prepped with  Chloraprep and draped in the usual sterile fashion.  The limb was exsanguinated with an Esmarch bandage and the tourniquet inflated to approximately higher than systolic BP.  Sutures were removed from the wounds of the right long finger. Jagged skin edges subcutaneous tissues were excisionally debrided sharply with scissors.  The wounds were extended using combinations of Bruner incisions and mid axial lines to expose the flexor tendon sheath. There was an opening in the sheath between A2 and A4 pulleys where the PIP joint capsule was evident, and there were no tendons. Both tendons were found to been retracted to the level of the A1 pulley. Through the zone of injury, the ulnar digital nerve was dissected free proximally and distally and seemed that the major portion of the trunk was in physical continuity. Using a tendon grasper, they were delivered to the open wound. Decision was made to excise the radial half of the FDS and the ulnar half was repaired to its native location with a 3-0 looped Supramid suture The FDP tendon end was freshened both proximally and distally. The A5 pulley was divided and the distal half of the A4 pulley was also divided to allow access for repair of the FDP which was done with 2 different 3-0 looped Supramid sutures in the method described by Nedra Hai, resulting in a 4 strand repair with a 60 proline epitendinous locked running suture as well.   There was no gapping of the tendon with the wrist placed in neutral and the finger in full extension. The digit had restored flexed resting posture.  Half percent Marcaine with epinephrine was instilled at the base of the digit for digital block, including the ring finger as well  and the tourniquet was released. Some additional hemostasis was obtained with bipolar electrocautery and the skin was closed with 4-0 Vicryl Rapide interrupted sutures. A forearm-based bulky hand dressing was applied with the wrist extended and the MPs flexed,  and he was awakened and taken to recovery room stable condition, breathing spontaneously.  DISPOSITION: He'll be discharged home today, likely returning to the office to hand therapy on Friday or Monday to have a custom fabricated splint constructed and initiation of flexor tendon rehabilitation. He will follow-up in the office with me in 10-15 days for a wound and splint check.

## 2014-09-17 NOTE — ED Notes (Signed)
Steel beam fell on right hand and right feet.  Rates pain 10/10.  On the job injury.

## 2014-09-17 NOTE — ED Notes (Signed)
PA at bedside.

## 2014-09-17 NOTE — ED Notes (Signed)
MD at bedside. 

## 2014-09-17 NOTE — H&P (Signed)
ORTHOPAEDIC CONSULTATION HISTORY & PHYSICAL REQUESTING PHYSICIAN: Bethann BerkshireJoseph Zammit, MD  Chief Complaint: right hand injury  HPI: [name] [name] is a 39 y.o. male whose right hand was injured earlier today when a heavy beam at work reportedly fell onto it, causing lacerations on the volar surfaces of the right long and ring fingers.  He had bleeding, and inability to flex the long finger  History reviewed. No pertinent past medical history. Past Surgical History  Procedure Laterality Date  . Incision and drainage perirectal abscess N/A 09/17/2012    Procedure: IRRIGATION AND DEBRIDEMENT PERIRECTAL ABSCESS;  Surgeon: Liz MaladyBurke E Clydia Nieves, MD;  Location: [facility] OR;  Service: General;  Laterality: N/A;   History   Social History  . Marital Status: Legally Separated    Spouse Name: N/A  . Number of Children: N/A  . Years of Education: N/A   Social History Main Topics  . Smoking status: Never Smoker   . Smokeless tobacco: Never Used  . Alcohol Use: 16.8 oz/week    28 Standard drinks or equivalent per week     Comment: weekends  . Drug Use: No  . Sexual Activity: Not on file   Other Topics Concern  . None   Social History Narrative   History reviewed. No pertinent family history. No Known Allergies Prior to Admission medications   Medication Sig Start Date End Date Taking? Authorizing Provider  amoxicillin-clavulanate (AUGMENTIN) 875-125 MG per tablet Take 1 tablet by mouth 2 (two) times daily. One po bid x 7 days Patient not taking: Reported on 01/28/2014 12/21/13   Junious SilkHannah Merrell, PA-C  HYDROcodone-acetaminophen (NORCO/VICODIN) 5-325 MG per tablet Take 1 tablet by mouth every 6 (six) hours as needed. 09/17/14   Bethann BerkshireJoseph Zammit, MD  predniSONE (DELTASONE) 10 MG tablet 6,5,4,3,2,1 Patient not taking: Reported on 09/17/2014 01/28/14   Collene GobbleSteven A Daub, MD   Dg Hand Complete Right  09/17/2014   CLINICAL DATA:  Pain after steel beam landed on hand  EXAM: RIGHT HAND - COMPLETE 3+ VIEW   COMPARISON:  None.  FINDINGS: Frontal, oblique, and lateral views obtained. There is a small focus of calcification just volar to the distal aspect of the fifth proximal phalanx, possibly representing a small avulsion type injury. No other potential fracture apparent. No dislocation. On the lateral view, there is calcification in the volar aspect of the third PIP joint, arthropathic in appearance. There is no appreciable joint space narrowing. No erosive change. No radiopaque foreign bodies.  IMPRESSION: Small focus of calcifications slightly volar to the distal aspect of the fifth proximal phalanx. This finding may represent a small avulsion injury. No other evidence suggesting fracture. No dislocation. No radiopaque foreign body. No appreciable joint space narrowing.   Electronically Signed   By: Bretta BangWilliam  Woodruff III M.D.   On: 09/17/2014 08:39   Dg Foot Complete Right  09/17/2014   CLINICAL DATA:  Right foot pain.  Fall.  Initial evaluation .  EXAM: RIGHT FOOT COMPLETE - 3+ VIEW  COMPARISON:  None.  FINDINGS: There is no evidence of fracture or dislocation. There is no evidence of arthropathy or other focal bone abnormality. Soft tissues are unremarkable.  IMPRESSION: Negative.   Electronically Signed   By: Maisie Fushomas  Register   On: 09/17/2014 08:41    Positive ROS: All other systems have been reviewed and were otherwise negative with the exception of those mentioned in the HPI and as above.  Physical Exam: Vitals: Refer to EMR. Constitutional:  WD, WN, NAD HEENT:  NCAT, EOMI Neuro/Psych:  Alert & oriented to person, place, and time; appropriate mood & affect Lymphatic: No generalized extremity edema or lymphadenopathy Extremities / MSK:  The extremities are normal with respect to appearance, ranges of motion, joint stability, muscle strength/tone, sensation, & perfusion except as otherwise noted:  Closed lacerations of the right long and ring fingers overlying the proximal phalanx.  The long finger  rests in a much more extended posture, but not fully extended.  Seems like he might have slight ability to flex the MP and PIP, but not the DIP.  Intact but altered LT sensibility on the R/U aspects of the digit distally.   Assessment: 1. Right ring finger laceration 2. Right long finger laceration, likely with flexor tendon laceration and possible injury to the ulnar digital nerve  Plan: Surgical exploration and repair of structures as indicated.  Goals, risks, and options reviewed and informed consent obtained.  Will proceed promptly at the [facility] surgery center.  Cliffton Asters Janee Morn, MD      Orthopaedic & Hand Surgery [facility] Orthopaedic & Sports Medicine [facility] [address] [place], [place]  [address] Office: [phone] Mobile: [phone]

## 2014-09-17 NOTE — Discharge Instructions (Addendum)
Discharge Instructions   You have a dressing with a plaster splint incorporated in it. Move your fingers as much as possible, making a full fist and fully opening the fist. Elevate your hand to reduce pain & swelling of the digits.  Ice over the operative site may be helpful to reduce pain & swelling.  DO NOT USE HEAT. Pain medicine has been prescribed for you.  Use your medicine as needed over the first 48 hours, and then you can begin to taper your use.  You may use Tylenol in place of your prescribed pain medication, but not IN ADDITION to it. Leave the dressing in place until you return to our office.  You may shower, but keep the bandage clean & dry.  You may drive a car when you are off of prescription pain medications and can safely control your vehicle with both hands. Our office will call you to arrange follow-up   Please call [phone] during normal business hours or [phone] after hours for any problems. Including the following:  - excessive redness of the incisions - drainage for more than 4 days - fever of more than 101.5 F  *Please note that pain medications will not be refilled after hours or on weekends.  WORK STATUS:  NO WORK UNTIL AT LEAST AFTER YOUR FIRST POSTOP APPT WITH ME IN 10-15 DAYS.    Post Anesthesia Home Care Instructions  Activity: Get plenty of rest for the remainder of the day. A responsible adult should stay with you for 24 hours following the procedure.  For the next 24 hours, DO NOT: -Drive a car -Advertising copywriterperate machinery -Drink alcoholic beverages -Take any medication unless instructed by your physician -Make any legal decisions or sign important papers.  Meals: Start with liquid foods such as gelatin or soup. Progress to regular foods as tolerated. Avoid greasy, spicy, heavy foods. If nausea and/or vomiting occur, drink only clear liquids until the nausea and/or vomiting subsides. Call your physician if vomiting continues.  Special  Instructions/Symptoms: Your throat may feel dry or sore from the anesthesia or the breathing tube placed in your throat during surgery. If this causes discomfort, gargle with warm salt water. The discomfort should disappear within 24 hours.  If you had a scopolamine patch placed behind your ear for the management of post- operative nausea and/or vomiting:  1. The medication in the patch is effective for 72 hours, after which it should be removed.  Wrap patch in a tissue and discard in the trash. Wash hands thoroughly with soap and water. 2. You may remove the patch earlier than 72 hours if you experience unpleasant side effects which may include dry mouth, dizziness or visual disturbances. 3. Avoid touching the patch. Wash your hands with soap and water after contact with the patch.

## 2014-09-17 NOTE — Anesthesia Preprocedure Evaluation (Addendum)
Anesthesia Evaluation  Patient identified by MRN, date of birth, ID band Patient awake    Reviewed: Allergy & Precautions, H&P , NPO status , Patient's Chart, lab work & pertinent test results  Airway Mallampati: II  TM Distance: >3 FB     Dental no notable dental hx. (+) Dental Advidsory Given   Pulmonary neg pulmonary ROS,  breath sounds clear to auscultation        Cardiovascular negative cardio ROS  Rhythm:regular Rate:Normal     Neuro/Psych negative neurological ROS  negative psych ROS   GI/Hepatic negative GI ROS, Neg liver ROS,   Endo/Other  negative endocrine ROS  Renal/GU negative Renal ROS     Musculoskeletal   Abdominal   Peds  Hematology   Anesthesia Other Findings   Reproductive/Obstetrics negative OB ROS                            Anesthesia Physical Anesthesia Plan  ASA: I  Anesthesia Plan: General LMA   Post-op Pain Management:    Induction:   Airway Management Planned:   Additional Equipment:   Intra-op Plan:   Post-operative Plan:   Informed Consent: I have reviewed the patients History and Physical, chart, labs and discussed the procedure including the risks, benefits and alternatives for the proposed anesthesia with the patient or authorized representative who has indicated his/her understanding and acceptance.   Dental Advisory Given  Plan Discussed with:   Anesthesia Plan Comments:         Anesthesia Quick Evaluation

## 2014-09-17 NOTE — ED Provider Notes (Signed)
  This is a shared visit.  I was asked by the attending physician, Dr. Estell HarpinZammit to suture the patient's lacerations.  This was my only involvement in the patient's care.     LACERATION REPAIR  #1 Performed by: Tyiana Hill L. Authorized by: Maxwell CaulRIPLETT,Rilie Glanz L. Consent: Verbal consent obtained. Risks and benefits: risks, benefits and alternatives were discussed Consent given by: patient Patient identity confirmed: provided demographic data Prepped and Draped in normal sterile fashion Wound explored  Laceration Location: right third finger (macerated)  Laceration Length: 3 cm  No Foreign Bodies seen or palpated  Anesthesia: digital block Local anesthetic: lidocaine 2 % w/o epinephrine  Anesthetic total: 2 ml  Irrigation method: syringe Amount of cleaning: standard  Skin closure: 4-0 prolene Number of sutures: 6  Technique: simple interrupted  Patient tolerance: Patient tolerated the procedure well with no immediate complications.    LACERATION REPAIR #2  Performed by: Khalani Novoa L. Authorized by: Maxwell CaulRIPLETT,Zavier Canela L. Consent: Verbal consent obtained. Risks and benefits: risks, benefits and alternatives were discussed Consent given by: patient Patient identity confirmed: provided demographic data Prepped and Draped in normal sterile fashion Wound explored  Laceration Location: right fourth finger  Laceration Length: 2.5 cm  No Foreign Bodies seen or palpated  Anesthesia: digital block Local anesthetic: lidocaine 2 % w/o epinephrine  Anesthetic total: 2 ml  Irrigation method: syringe Amount of cleaning: standard  Skin closure: 4-0 prolene  Number of sutures: 5  Technique: simple interrupted  Patient tolerance: Patient tolerated the procedure well with no immediate complications.     Wound edges approximated.  Bleeding controlled.    Pt has extension and flexion of the right fourth finger , no tendon or bony injury seen on exam.    Pt unable to  palmar flex right third finger.  Tendon not visualized during wound exploration.  Dr. Estell HarpinZammit notified prior to closure.    Pauline Ausammy Eberardo Demello, PA-C 09/17/14 1055  Bethann BerkshireJoseph Zammit, MD 09/17/14 [phone]

## 2014-09-17 NOTE — ED Provider Notes (Signed)
CSN: [id number]     Arrival date & time 09/17/14  0808 History  This chart was scribed for Bethann Berkshire, MD by Marica Otter, ED Scribe. This patient was seen in room APA18/APA18 and the patient's care was started at 8:40 AM.   Chief Complaint  Patient presents with  . Hand Injury  . Ankle Pain   Patient is a 39 y.o. male presenting with hand injury and ankle pain. The history is provided by the patient. No language interpreter was used.  Hand Injury Location:  Hand Injury: yes   Mechanism of injury: crush   Crush injury:    Mechanism:  Falling object Hand location:  R hand Pain details:    Severity:  Severe   Onset quality:  Sudden   Timing:  Constant   Progression:  Unchanged Chronicity:  New Handedness:  Right-handed Relieved by:  Nothing Worsened by:  Nothing tried Ineffective treatments:  None tried Associated symptoms: decreased range of motion (secondary to pain)   Associated symptoms: no back pain, no fatigue and no fever   Ankle Pain Pain details:    Quality:  Throbbing   Severity:  Severe   Onset quality:  Sudden   Timing:  Constant   Progression:  Unchanged Chronicity:  New Relieved by:  None tried Worsened by:  Nothing tried Ineffective treatments:  None tried Associated symptoms: no back pain, no fatigue and no fever    PCP: No PCP Per Patient HPI Comments: [name] [name] is a 39 y.o. male who presents to the Emergency Department complaining of a laceration to the right middle and right ring fingers hand with associated 10/10 pain and 10/10 right feet pain onset this morning after a beam fell on his right hand and feet while working.   History reviewed. No pertinent past medical history. Past Surgical History  Procedure Laterality Date  . Incision and drainage perirectal abscess N/A 09/17/2012    Procedure: IRRIGATION AND DEBRIDEMENT PERIRECTAL ABSCESS;  Surgeon: Liz Malady, MD;  Location: [facility] OR;  Service: General;  Laterality: N/A;    History reviewed. No pertinent family history. History  Substance Use Topics  . Smoking status: Never Smoker   . Smokeless tobacco: Never Used  . Alcohol Use: 16.8 oz/week    28 Standard drinks or equivalent per week     Comment: weekends    Review of Systems  Constitutional: Negative for fever, appetite change and fatigue.  HENT: Negative for congestion, ear discharge and sinus pressure.   Eyes: Negative for discharge.  Respiratory: Negative for cough.   Cardiovascular: Negative for chest pain.  Gastrointestinal: Negative for abdominal pain and diarrhea.  Genitourinary: Negative for frequency and hematuria.  Musculoskeletal: Positive for arthralgias (right hand and right feet). Negative for back pain.  Skin: Positive for wound. Negative for rash.  Neurological: Negative for seizures and headaches.  Psychiatric/Behavioral: Negative for hallucinations.      Allergies  Review of patient's allergies indicates no known allergies.  Home Medications   Prior to Admission medications   Medication Sig Start Date End Date Taking? Authorizing Provider  amoxicillin-clavulanate (AUGMENTIN) 875-125 MG per tablet Take 1 tablet by mouth 2 (two) times daily. One po bid x 7 days Patient not taking: Reported on 01/28/2014 12/21/13   Junious Silk, PA-C  HYDROcodone-acetaminophen [facility]) 5-325 MG per tablet 1 po q 4-6 hrs prn 01/28/14   Collene Gobble, MD  HYDROcodone-acetaminophen (NORCO/VICODIN) 5-325 MG per tablet Take 1 tablet by mouth every 6 (six)  hours as needed for moderate pain or severe pain. Patient not taking: Reported on 01/28/2014 12/21/13   Junious SilkHannah Merrell, PA-C  naproxen (NAPROSYN) 500 MG tablet Take 500 mg by mouth 2 (two) times daily with a meal.    Historical Provider, MD  predniSONE (DELTASONE) 10 MG tablet 6,5,4,3,2,1 01/28/14   Collene GobbleSteven A Daub, MD   Triage Vitals: BP 157/106 mmHg  Pulse 81  Temp(Src) 98 F (36.7 C) (Oral)  Resp 20  Ht 5\' 5"  (1.651 m)  Wt 176 lb  (79.833 kg)  BMI 29.29 kg/m2  SpO2 100% Physical Exam  Constitutional: He is oriented to person, place, and time. He appears well-developed.  HENT:  Head: Normocephalic.  Eyes: Conjunctivae are normal.  Neck: No tracheal deviation present.  Cardiovascular:  No murmur heard. Musculoskeletal: Tenderness: Decreased ROM to left middle finger secondary to pain.  Some bruising to right ventral ankle. Pt has decrease flexion to left middle finger,  Flexor tendon injury  Neurological: He is oriented to person, place, and time.  Skin: Skin is warm. Laceration noted.  1 cm laceration to left ring  1.5 cm laceration to left ring finger   Psychiatric: He has a normal mood and affect.    ED Course  Procedures (including critical care time) DIAGNOSTIC STUDIES: Oxygen Saturation is 100% on RA, nl by my interpretation.    COORDINATION OF CARE: 8:44 AM-Discussed treatment plan with pt at bedside and pt agreed to plan.   Labs Review Labs Reviewed - No data to display  Imaging Review No results found.   EKG Interpretation None      MDM   Final diagnoses:  None    Pt with laceration to right middle and ring finger.  Pt cannot flex his middle finger.  Spoke with dr. Mack Hookavid Thompson and [facility].  Pt also has a bruised foot and he will follow up with ortho for that  The chart was scribed for me under my direct supervision.  I personally performed the history, physical, and medical decision making and all procedures in the evaluation of this patient.Bethann Berkshire.   Arly Salminen, MD 09/17/14 1130

## 2014-09-17 NOTE — Anesthesia Procedure Notes (Signed)
Procedure Name: LMA Insertion Date/Time: 09/17/2014 2:30 PM Performed by: Curly ShoresRAFT, Traeton Bordas W Pre-anesthesia Checklist: Patient identified, Emergency Drugs available, Suction available and Patient being monitored Patient Re-evaluated:Patient Re-evaluated prior to inductionOxygen Delivery Method: Circle System Utilized Preoxygenation: Pre-oxygenation with 100% oxygen Intubation Type: IV induction Ventilation: Mask ventilation without difficulty LMA: LMA inserted LMA Size: 5.0 Number of attempts: 1 Airway Equipment and Method: Bite block Placement Confirmation: positive ETCO2 and breath sounds checked- equal and bilateral Tube secured with: Tape Dental Injury: Teeth and Oropharynx as per pre-operative assessment

## 2014-09-17 NOTE — Anesthesia Postprocedure Evaluation (Signed)
Anesthesia Post Note  Patient: [name] [name]  Procedure(s) Performed: Procedure(s) (LRB): FLEXOR TENDON REPAIR right long finger  (Right)  Anesthesia type: general  Patient location: PACU  Post pain: Pain level controlled  Post assessment: Patient's Cardiovascular Status Stable  Last Vitals:  Filed Vitals:   09/17/14 1630  BP: 126/81  Pulse: 84  Temp:   Resp: 22    Post vital signs: Reviewed and stable  Level of consciousness: sedated  Complications: No apparent anesthesia complications

## 2014-09-17 NOTE — Transfer of Care (Signed)
Immediate Anesthesia Transfer of Care Note  Patient: [name] [name]  Procedure(s) Performed: Procedure(s): FLEXOR TENDON REPAIR right long finger  (Right)  Patient Location: PACU  Anesthesia Type:General  Level of Consciousness: awake, alert  and oriented  Airway & Oxygen Therapy: Patient Spontanous Breathing and Patient connected to face mask oxygen  Post-op Assessment: Report given to RN, Post -op Vital signs reviewed and stable and Patient moving all extremities  Post vital signs: Reviewed and stable  Last Vitals:  Filed Vitals:   09/17/14 1315  BP: 163/94  Pulse: 91  Temp: 36.9 C  Resp: 20    Complications: No apparent anesthesia complications

## 2014-09-18 ENCOUNTER — Encounter (HOSPITAL_BASED_OUTPATIENT_CLINIC_OR_DEPARTMENT_OTHER): Payer: Self-pay | Admitting: Orthopedic Surgery

## 2014-10-15 ENCOUNTER — Encounter (HOSPITAL_COMMUNITY): Payer: Self-pay | Admitting: Neurology

## 2014-10-15 ENCOUNTER — Emergency Department (HOSPITAL_COMMUNITY): Payer: Self-pay

## 2014-10-15 ENCOUNTER — Emergency Department (HOSPITAL_COMMUNITY)
Admission: EM | Admit: 2014-10-15 | Discharge: 2014-10-15 | Disposition: A | Discharge status: Home / Self Care | Payer: Self-pay | Attending: Emergency Medicine | Admitting: Emergency Medicine

## 2014-10-15 DIAGNOSIS — Z792 Long term (current) use of antibiotics: Secondary | ICD-10-CM | POA: Insufficient documentation

## 2014-10-15 DIAGNOSIS — M898X8 Other specified disorders of bone, other site: Secondary | ICD-10-CM | POA: Insufficient documentation

## 2014-10-15 DIAGNOSIS — R634 Abnormal weight loss: Secondary | ICD-10-CM

## 2014-10-15 DIAGNOSIS — R1110 Vomiting, unspecified: Secondary | ICD-10-CM | POA: Insufficient documentation

## 2014-10-15 DIAGNOSIS — R599 Enlarged lymph nodes, unspecified: Secondary | ICD-10-CM

## 2014-10-15 DIAGNOSIS — R0789 Other chest pain: Secondary | ICD-10-CM

## 2014-10-15 LAB — COMPREHENSIVE METABOLIC PANEL
ALT: 49 U/L (ref 17–63)
AST: 29 U/L (ref 15–41)
Albumin: 4.6 g/dL (ref 3.5–5.0)
Alkaline Phosphatase: 64 U/L (ref 38–126)
Anion gap: 10 (ref 5–15)
BILIRUBIN TOTAL: 0.7 mg/dL (ref 0.3–1.2)
BUN: 9 mg/dL (ref 6–20)
CO2: 26 mmol/L (ref 22–32)
Calcium: 9.6 mg/dL (ref 8.9–10.3)
Chloride: 104 mmol/L (ref 101–111)
Creatinine, Ser: 0.79 mg/dL (ref 0.61–1.24)
GFR calc Af Amer: >60 mL/min (ref >60)
GFR calc non Af Amer: >60 mL/min (ref >60)
GLUCOSE: 108 mg/dL — AB (ref 65–99)
Potassium: 3.7 mmol/L (ref 3.5–5.1)
Sodium: 140 mmol/L (ref 135–145)
TOTAL PROTEIN: 7.7 g/dL (ref 6.5–8.1)

## 2014-10-15 LAB — CBC WITH DIFFERENTIAL/PLATELET
BASOS ABS: 0 10*3/uL (ref 0.0–0.1)
BASOS PCT: 0 % (ref 0–1)
Eosinophils Absolute: 0.3 10*3/uL (ref 0.0–0.7)
Eosinophils Relative: 3 % (ref 0–5)
HEMATOCRIT: 47.1 % (ref 39.0–52.0)
HEMOGLOBIN: 16.4 g/dL (ref 13.0–17.0)
Lymphocytes Relative: 25 % (ref 12–46)
Lymphs Abs: 2.2 10*3/uL (ref 0.7–4.0)
MCH: 31.3 pg (ref 26.0–34.0)
MCHC: 34.8 g/dL (ref 30.0–36.0)
MCV: 89.9 fL (ref 78.0–100.0)
MONO ABS: 0.5 10*3/uL (ref 0.1–1.0)
Monocytes Relative: 6 % (ref 3–12)
NEUTROS PCT: 66 % (ref 43–77)
Neutro Abs: 6 10*3/uL (ref 1.7–7.7)
Platelets: 273 10*3/uL (ref 150–400)
RBC: 5.24 MIL/uL (ref 4.22–5.81)
RDW: 12.4 % (ref 11.5–15.5)
WBC: 9 10*3/uL (ref 4.0–10.5)

## 2014-10-15 LAB — LIPASE, BLOOD: LIPASE: 30 U/L (ref 22–51)

## 2014-10-15 MED ORDER — IOHEXOL 300 MG/ML  SOLN
75.0000 mL | Freq: Once | INTRAMUSCULAR | Status: AC | PRN
  Administered 2014-10-15: 100 mL via INTRAVENOUS

## 2014-10-15 NOTE — ED Notes (Signed)
Pt reports mass below sternum for 6 months. Sometimes he feels like he can't eat a lot because of it. Denies sob.

## 2014-10-15 NOTE — ED Provider Notes (Signed)
CSN: [id number]     Arrival date & time 10/15/14  1513 History  This chart was scribed for [employer], PA-C, working with Elwin Mocha, MD by Elon Spanner, ED Scribe. This patient was seen in room TR02C/TR02C and the patient's care was started at 4:12 PM.   Chief Complaint  Patient presents with  . Mass   Patient is a 39 y.o. male presenting with general illness. The history is provided by the patient. No language interpreter was used.  Illness Location:  Mass at bottom of sternum Quality:  No pain Severity:  Mild Onset quality:  Gradual Duration:  6 months Timing:  Constant Progression:  Unchanged Chronicity:  New Context:  None Relieved by:  None tried Worsened by:  None tried Ineffective treatments:  None tried Associated symptoms: vomiting (intermittent after eating, none ongoing)   Associated symptoms: no abdominal pain, no chest pain, no diarrhea, no fever, no myalgias, no nausea and no shortness of breath     HPI Comments: [name] [name] is a 39 y.o. male who presents to the Emergency Department complaining of a gradually worsening mass on the lower central chest onset 6-7 months ago without known injury or cause. It does not hurt but it causes him to feel full and like there's "a hole" there when he eats.  He reports some associated intermittent non-bloody emesis only sometimes after eating large quantities.  There has been 8 lbs of unintended weight loss over the past two months.  He denies redness, warmth, fever, chills, CP, SOB, abdominal pain, nausea, diarrhea, constipation, hematuria, dysuria, myalgias, arthralgias, numbness, tingling, weakness, or rashes.    History reviewed. No pertinent past medical history. Past Surgical History  Procedure Laterality Date  . Incision and drainage perirectal abscess N/A 09/17/2012    Procedure: IRRIGATION AND DEBRIDEMENT PERIRECTAL ABSCESS;  Surgeon: Liz Malady, MD;  Location: [facility] OR;  Service: General;   Laterality: N/A;  . Flexor tendon repair Right 09/17/2014    Procedure: FLEXOR TENDON REPAIR right long finger ;  Surgeon: Mack Hook, MD;  Location: [facility];  Service: Orthopedics;  Laterality: Right;   No family history on file. Social History  Substance Use Topics  . Smoking status: Never Smoker   . Smokeless tobacco: Never Used  . Alcohol Use: 16.8 oz/week    28 Standard drinks or equivalent per week     Comment: weekends    Review of Systems  Constitutional: Positive for unexpected weight change (8# loss in 2 months). Negative for fever and chills.  Respiratory: Negative for shortness of breath.   Cardiovascular: Negative for chest pain.  Gastrointestinal: Positive for vomiting (intermittent after eating, none ongoing). Negative for nausea, abdominal pain, diarrhea, constipation and blood in stool.  Genitourinary: Negative for dysuria and hematuria.  Musculoskeletal: Negative for myalgias, joint swelling and arthralgias.       +mass at edge of sternum/chest  Skin: Negative for color change.  Allergic/Immunologic: Negative for immunocompromised state.  Neurological: Negative for weakness and numbness.  10 Systems reviewed and all are negative for acute change except as noted in the HPI.   Allergies  Review of patient's allergies indicates no known allergies.  Home Medications   Prior to Admission medications   Medication Sig Start Date End Date Taking? Authorizing Provider  acetaminophen (TYLENOL) 500 MG tablet Take 1,000 mg by mouth every 6 (six) hours as needed.    Historical Provider, MD  amoxicillin-clavulanate (AUGMENTIN) 875-125 MG per tablet Take 1 tablet  by mouth 2 (two) times daily. One po bid x 7 days 09/17/14   Mack Hook, MD  HYDROcodone-acetaminophen [facility]) 5-325 MG per tablet Take 1-2 tablets by mouth every 6 (six) hours as needed for moderate pain. 09/17/14   Mack Hook, MD  HYDROcodone-acetaminophen (NORCO/VICODIN) 5-325 MG per  tablet Take 1 tablet by mouth every 6 (six) hours as needed. 09/17/14   Bethann Berkshire, MD  predniSONE (DELTASONE) 10 MG tablet 6,5,4,3,2,1 Patient not taking: Reported on 09/17/2014 01/28/14   Collene Gobble, MD   BP 155/108 mmHg  Pulse 85  Temp(Src) 98.3 F (36.8 C) (Oral)  Resp 18  SpO2 98% Physical Exam  Constitutional: He is oriented to person, place, and time. Vital signs are normal. He appears well-developed and well-nourished.  Non-toxic appearance. No distress.  Afebrile, nontoxic, NAD  HENT:  Head: Normocephalic and atraumatic.  Mouth/Throat: Oropharynx is clear and moist and mucous membranes are normal.  Eyes: Conjunctivae and EOM are normal. Right eye exhibits no discharge. Left eye exhibits no discharge.  Neck: Normal range of motion. Neck supple.  Cardiovascular: Normal rate, regular rhythm, normal heart sounds and intact distal pulses.  Exam reveals no gallop and no friction rub.   No murmur heard. Pulmonary/Chest: Effort normal and breath sounds normal. No respiratory distress. He has no decreased breath sounds. He has no wheezes. He has no rhonchi. He has no rales. He exhibits mass and tenderness. He exhibits no crepitus, no deformity and no retraction.    ~ 3 cm ?mass at the xyphoid region which is mildly TTP along the lower margin extending towards with costal margin.  No crepitus, retractions, or chest wall deformity.  No skin changes.    Abdominal: Soft. Normal appearance and bowel sounds are normal. He exhibits no distension. There is no tenderness. There is no rigidity, no rebound, no guarding, no tenderness at McBurney's point and negative Murphy's sign.  Soft, NTND, +BS throughout, no r/g/r, neg murphy's, neg mcburney's,  Musculoskeletal: Normal range of motion.  Neurological: He is alert and oriented to person, place, and time. He has normal strength. No sensory deficit.  Skin: Skin is warm, dry and intact. No rash noted.  Psychiatric: He has a normal mood and  affect.  Nursing note and vitals reviewed.   ED Course  Procedures (including critical care time)  DIAGNOSTIC STUDIES: Oxygen Saturation is 98% on RA, normal by my interpretation.    COORDINATION OF CARE:  4:17 PM Will discuss with attending  Patient acknowledges and agrees with plan.    Labs Review Labs Reviewed  COMPREHENSIVE METABOLIC PANEL - Abnormal; Notable for the following:    Glucose, Bld 108 (*)    All other components within normal limits  CBC WITH DIFFERENTIAL/PLATELET  LIPASE, BLOOD    Imaging Review Ct Chest W Contrast  10/15/2014   CLINICAL DATA:  Mass below sternum for 6 months.  Vomiting.  EXAM: CT CHEST WITH CONTRAST  TECHNIQUE: Multidetector CT imaging of the chest was performed during intravenous contrast administration.  CONTRAST:  OMNIPAQUE IOHEXOL 300 MG/ML  SOLN  COMPARISON:  Abdominal pelvic CT of 09/17/2012.  FINDINGS: Mediastinum/Nodes: Normal heart size, without pericardial effusion. No mediastinal or hilar adenopathy.  Lungs/Pleura: No pleural fluid. Minimal subpleural irregularity in the left lower lobe, likely due to scarring. Example images 35 and 36.  A perifissural 4 mm nodule along the left major fissure on image 18 is most likely a subpleural lymph node.  Upper abdomen: Possible mild hepatic steatosis. Normal  imaged portions of the spleen, stomach, pancreas, gallbladder, biliary tract, adrenal glands, kidneys.  Musculoskeletal: No sternal or pre sternal mass identified. No hernia, including at the inferior aspect of the sternum. The inferior aspect of the xiphoid process extends anteriorly, including on sagittal image 66.  IMPRESSION: 1. No evidence of sternal or parasternal mass. Anterior angulation of the inferior aspect of the xiphoid process could cause a palpable abnormality. 2.  No acute process in the chest. 3. Probable subpleural lymph node along the left major fissure. If the patient is at high risk for bronchogenic carcinoma, follow-up  chest CT at 1 year is recommended. If the patient is at low risk, no follow-up is needed. This recommendation follows the consensus statement: "Guidelines for Management of Small Pulmonary Nodules Detected on CT Scans: A Statement from the Fleischner Society" as published in Radiology 2005; 237:395-400. Available online at: DietDisorder.cz. 4. Possible mild hepatic steatosis.   Electronically Signed   By: Jeronimo Greaves M.D.   On: 10/15/2014 17:53   I have personally reviewed and evaluated these images and lab results as part of my medical decision-making.   EKG Interpretation None      MDM   Final diagnoses:  Xyphoidalgia  Weight loss, non-intentional  Lymph node enlargement    39 y.o. male here with a mass to the lower chest wall at the xyphoid process which is approx 3cm in diameter, mildly TTP, does not seem to be the xyphoid due to being somewhat of a demarcated mass extending up onto the sternum. 8# wt loss in 2 months. Occasional vomiting after eating due to feeling like there's a "hole" there. No skin changes. Difficult to determine what this mass could be, will obtain basic labs and CT chest to define the mass further. Declines pain meds. Will reassess shortly.  6:28 PM Labs unremarkable. CT chest shows that the mass is in fact his xyphoid. It's odd that it's demarcated, and that it's grown according to the pt. Also shows subpleural lymph node. Pt is a prior smoker, has not smoked in 9yrs, but given this hx will have him f/up with [facility] in 1wk to establish care and have repeat CT in 11yr. Discussed tylenol/motrin and heat as needed for pain. I explained the diagnosis and have given explicit precautions to return to the ER including for any other new or worsening symptoms. The patient understands and accepts the medical plan as it's been dictated and I have answered their questions. Discharge instructions concerning home care and prescriptions have been  given. The patient is STABLE and is discharged to home in good condition.   I personally performed the services described in this documentation, which was scribed in my presence. The recorded information has been reviewed and is accurate.  BP 155/108 mmHg  Pulse 85  Temp(Src) 98.3 F (36.8 C) (Oral)  Resp 18  SpO2 98%    Kayston Jodoin Camprubi-Soms, PA-C 10/15/14 1830  Elwin Mocha, MD 10/15/14 2306

## 2014-10-15 NOTE — Discharge Instructions (Signed)
Your chest scan showed that the mass on your chest is a bone. This is normal. It also showed a lymph node in your chest which is nothing to worry about and you will just need to have another scan in 1 year to recheck it. Use heat or tylenol/motrin as needed for pain. Follow up with [place] [facility] in 1 week for recheck of symptoms and to establish medical care. Return to the ER for changes or worsening symptoms.   Swollen Lymph Nodes The lymphatic system filters fluid from around cells. It is like a system of blood vessels. These channels carry lymph instead of blood. The lymphatic system is an important part of the immune (disease fighting) system. When people talk about "swollen glands in the neck," they are usually talking about swollen lymph nodes. The lymph nodes are like the little traps for infection. You and your caregiver may be able to feel lymph nodes, especially swollen nodes, in these common areas: the groin (inguinal area), armpits (axilla), and above the clavicle (supraclavicular). You may also feel them in the neck (cervical) and the back of the head just above the hairline (occipital). Swollen glands occur when there is any condition in which the body responds with an allergic type of reaction. For instance, the glands in the neck can become swollen from insect bites or any type of minor infection on the head. These are very noticeable in children with only minor problems. Lymph nodes may also become swollen when there is a tumor or problem with the lymphatic system, such as Hodgkin's disease. TREATMENT   Most swollen glands do not require treatment. They can be observed (watched) for a short period of time, if your caregiver feels it is necessary. Most of the time, observation is not necessary.  Antibiotics (medicines that kill germs) may be prescribed by your caregiver. Your caregiver may prescribe these if he or she feels the swollen glands are due to a bacterial (germ)  infection. Antibiotics are not used if the swollen glands are caused by a virus. HOME CARE INSTRUCTIONS   Take medications as directed by your caregiver. Only take over-the-counter or prescription medicines for pain, discomfort, or fever as directed by your caregiver. SEEK MEDICAL CARE IF:   If you begin to run a temperature greater than 102 F (38.9 C), or as your caregiver suggests. MAKE SURE YOU:   Understand these instructions.  Will watch your condition.  Will get help right away if you are not doing well or get worse. Document Released: 02/05/2002 Document Revised: 05/10/2011 Document Reviewed: 02/15/2005 [facility] Patient Information 2015 [place], [place]. This information is not intended to replace advice given to you by your health care provider. Make sure you discuss any questions you have with your health care provider.  Musculoskeletal Pain Musculoskeletal pain is muscle and boney aches and pains. These pains can occur in any part of the body. Your caregiver may treat you without knowing the cause of the pain. They may treat you if blood or urine tests, X-rays, and other tests were normal.  CAUSES There is often not a definite cause or reason for these pains. These pains may be caused by a type of germ (virus). The discomfort may also come from overuse. Overuse includes working out too hard when your body is not fit. Boney aches also come from weather changes. Bone is sensitive to atmospheric pressure changes. HOME CARE INSTRUCTIONS   Ask when your test results will be ready. Make sure you  get your test results.  Only take over-the-counter or prescription medicines for pain, discomfort, or fever as directed by your caregiver. If you were given medications for your condition, do not drive, operate machinery or power tools, or sign legal documents for 24 hours. Do not drink alcohol. Do not take sleeping pills or other medications that may interfere with treatment.  Continue all  activities unless the activities cause more pain. When the pain lessens, slowly resume normal activities. Gradually increase the intensity and duration of the activities or exercise.  During periods of severe pain, bed rest may be helpful. Lay or sit in any position that is comfortable.  Putting ice on the injured area.  Put ice in a bag.  Place a towel between your skin and the bag.  Leave the ice on for 15 to 20 minutes, 3 to 4 times a day.  Follow up with your caregiver for continued problems and no reason can be found for the pain. If the pain becomes worse or does not go away, it may be necessary to repeat tests or do additional testing. Your caregiver may need to look further for a possible cause. SEEK IMMEDIATE MEDICAL CARE IF:  You have pain that is getting worse and is not relieved by medications.  You develop chest pain that is associated with shortness or breath, sweating, feeling sick to your stomach (nauseous), or throw up (vomit).  Your pain becomes localized to the abdomen.  You develop any new symptoms that seem different or that concern you. MAKE SURE YOU:   Understand these instructions.  Will watch your condition.  Will get help right away if you are not doing well or get worse. Document Released: 02/15/2005 Document Revised: 05/10/2011 Document Reviewed: 10/20/2012 [facility] Patient Information 2015 [place], [place]. This information is not intended to replace advice given to you by your health care provider. Make sure you discuss any questions you have with your health care provider.

## 2014-10-15 NOTE — ED Notes (Signed)
Pt reports knot on lower chest/upper abdomen x7 months. Know it palpable upon assessment. Patient denies pain or sob at this time but states that sometimes he feels the knot when he is trying to eat. nad.

## 2015-03-02 DIAGNOSIS — M6780 Other specified disorders of synovium and tendon, unspecified site: Secondary | ICD-10-CM

## 2015-03-02 DIAGNOSIS — M24541 Contracture, right hand: Secondary | ICD-10-CM

## 2015-03-02 HISTORY — DX: Contracture, right hand: M24.541

## 2015-03-02 HISTORY — DX: Other specified disorders of synovium and tendon, unspecified site: M67.80

## 2015-03-07 ENCOUNTER — Other Ambulatory Visit: Payer: Self-pay | Admitting: Orthopedic Surgery

## 2015-03-07 ENCOUNTER — Encounter (HOSPITAL_BASED_OUTPATIENT_CLINIC_OR_DEPARTMENT_OTHER): Payer: Self-pay | Admitting: *Deleted

## 2015-03-10 NOTE — H&P (Signed)
[name] [name] is an 40 y.o. male.   CC / Reason for Visit: Right long finger followup HPI: This patient returns for reevaluation today.  He reports that despite working on his motion, not much has changed for him clinically--still with flexion contracture and poor pull-thru of flexor tendon  Past Medical History  Diagnosis Date  . 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 Adhesions, flexor or extensor tendons 03/2015    flexor adhesions right long finger    Past Surgical History  Procedure Laterality Date  . Incision and drainage perirectal abscess N/A 09/17/2012    Procedure: IRRIGATION AND DEBRIDEMENT PERIRECTAL ABSCESS;  Surgeon: Liz MaladyBurke E Jaylene Schrom, MD;  Location: [facility] OR;  Service: General;  Laterality: N/A;  . Flexor tendon repair Right 09/17/2014    Procedure: FLEXOR TENDON REPAIR right long finger ;  Surgeon: Mack Hookavid Brenn Gatton, MD;  Location: [facility];  Service: Orthopedics;  Laterality: Right;    History reviewed. No pertinent family history. Social History:  reports that he quit smoking about 6 years ago. He has never used smokeless tobacco. He reports that he drinks alcohol. He reports that he does not use illicit drugs.  Allergies: No Known Allergies  No prescriptions prior to admission    No results found for this or any previous visit (from the past 48 hour(s)). No results found.  Review of Systems  All other systems reviewed and are negative.   Height 5\' 5"  (1.651 m), weight 79.379 kg (175 lb). Physical Exam  Constitutional:  WD, WN, NAD HEENT:  NCAT, EOMI Neuro/Psych:  Alert & oriented to person, place, and time; appropriate mood & affect Lymphatic: No generalized UE edema or lymphadenopathy Extremities / MSK:  Both UE are normal with respect to appearance, ranges of motion, joint stability, muscle strength/tone, sensation, & perfusion except as otherwise noted:  The scar is well healed, but with some volar skin  contracture given that a portion of the wound is quite longitudinal.  PIP flexion contracture measures 40.  He continues to have paresthesias reported when tapping on the end of the digit and also along the ulnar digital nerve over the entire zone between the proximal digital crease of the DIP joint.  He can flex the MP to 85, PIP to 92, DIP to 25, much more passively, indicating lack of good pull-through.  He can generate tension and it seems as if both tendons are intact.  Labs / Xrays:  No radiographic studies obtained today.  Assessment:  Nearly 5 months postop, with flexor tendon adhesions, ulnar digital nerve paresthesias, and PIP flexion contracture  Plan: I discussed these findings with him and reviewed surgical options for improving upon his function.  He would like to proceed in that direction.  We will plan to hold formal therapy until after surgery, at which time he will likely need to be 3 times a week for at least the 1st couple of weeks before tapering schedule.  I would plan to perform surgery without general anesthesia, either with a wrist block or digital block augmented if needed with anesthetic in the palm.  I discussed with him the details of her right long finger PIP flexion contracture release, scar revision likely with Z-plasty, flexor tenolysis.  I doubt that extensor tenolysis will be necessary.  We will plan to proceed once workers compensation authorization is received.  Joury Allcorn A. 03/10/2015, 3:36 PM

## 2015-03-11 ENCOUNTER — Ambulatory Visit (HOSPITAL_BASED_OUTPATIENT_CLINIC_OR_DEPARTMENT_OTHER): Payer: Worker's Compensation | Admitting: Anesthesiology

## 2015-03-11 ENCOUNTER — Ambulatory Visit (HOSPITAL_BASED_OUTPATIENT_CLINIC_OR_DEPARTMENT_OTHER)
Admission: RE | Admit: 2015-03-11 | Discharge: 2015-03-11 | Disposition: A | Discharge status: Home / Self Care | Payer: Worker's Compensation | Source: Ambulatory Visit | Attending: Orthopedic Surgery | Admitting: Orthopedic Surgery

## 2015-03-11 ENCOUNTER — Encounter (HOSPITAL_BASED_OUTPATIENT_CLINIC_OR_DEPARTMENT_OTHER)
Admission: RE | Disposition: A | Discharge status: Home / Self Care | Payer: Self-pay | Source: Ambulatory Visit | Attending: Orthopedic Surgery

## 2015-03-11 ENCOUNTER — Encounter (HOSPITAL_BASED_OUTPATIENT_CLINIC_OR_DEPARTMENT_OTHER): Payer: Self-pay | Admitting: *Deleted

## 2015-03-11 DIAGNOSIS — M24541 Contracture, right hand: Secondary | ICD-10-CM | POA: Diagnosis present

## 2015-03-11 DIAGNOSIS — M62441 Contracture of muscle, right hand: Secondary | ICD-10-CM | POA: Diagnosis not present

## 2015-03-11 DIAGNOSIS — Z87891 Personal history of nicotine dependence: Secondary | ICD-10-CM | POA: Insufficient documentation

## 2015-03-11 DIAGNOSIS — M65841 Other synovitis and tenosynovitis, right hand: Secondary | ICD-10-CM | POA: Insufficient documentation

## 2015-03-11 HISTORY — PX: TENOLYSIS: SHX396

## 2015-03-11 HISTORY — DX: Other specified disorders of synovium and tendon, unspecified site: M67.80

## 2015-03-11 HISTORY — DX: Contracture, right hand: M24.541

## 2015-03-11 SURGERY — INCISION, TENDON SHEATH
Anesthesia: Monitor Anesthesia Care | Site: Hand | Laterality: Right

## 2015-03-11 MED ORDER — MIDAZOLAM HCL 2 MG/2ML IJ SOLN
INTRAMUSCULAR | Status: AC
  Filled 2015-03-11: qty 2

## 2015-03-11 MED ORDER — FENTANYL CITRATE (PF) 100 MCG/2ML IJ SOLN
50.0000 ug | INTRAMUSCULAR | Status: AC | PRN
  Administered 2015-03-11 (×4): 50 ug via INTRAVENOUS
  Administered 2015-03-11: 100 ug via INTRAVENOUS

## 2015-03-11 MED ORDER — PROPOFOL 10 MG/ML IV BOLUS
INTRAVENOUS | Status: AC
  Filled 2015-03-11: qty 20

## 2015-03-11 MED ORDER — FENTANYL CITRATE (PF) 100 MCG/2ML IJ SOLN
INTRAMUSCULAR | Status: AC
  Filled 2015-03-11: qty 2

## 2015-03-11 MED ORDER — PROPOFOL 500 MG/50ML IV EMUL
INTRAVENOUS | Status: DC | PRN
  Administered 2015-03-11: 100 ug/kg/min via INTRAVENOUS

## 2015-03-11 MED ORDER — LACTATED RINGERS IV SOLN
INTRAVENOUS | Status: DC
  Administered 2015-03-11 (×2): via INTRAVENOUS

## 2015-03-11 MED ORDER — ACETAMINOPHEN 325 MG PO TABS: 325.0000 mg | ORAL_TABLET | ORAL | Status: DC | PRN

## 2015-03-11 MED ORDER — SCOPOLAMINE 1 MG/3DAYS TD PT72: 1.0000 | MEDICATED_PATCH | Freq: Once | TRANSDERMAL | Status: DC

## 2015-03-11 MED ORDER — BUPIVACAINE-EPINEPHRINE (PF) 0.5% -1:200000 IJ SOLN
INTRAMUSCULAR | Status: DC | PRN
  Administered 2015-03-11: 30 mL via PERINEURAL

## 2015-03-11 MED ORDER — CEFAZOLIN SODIUM-DEXTROSE 2-3 GM-% IV SOLR
2.0000 g | INTRAVENOUS | Status: AC
Start: 2015-03-12 — End: 2015-03-11
  Administered 2015-03-11: 2 g via INTRAVENOUS

## 2015-03-11 MED ORDER — MIDAZOLAM HCL 2 MG/2ML IJ SOLN
1.0000 mg | INTRAMUSCULAR | Status: DC | PRN
  Administered 2015-03-11 (×2): 2 mg via INTRAVENOUS

## 2015-03-11 MED ORDER — OXYCODONE HCL 5 MG PO TABS: 5.0000 mg | ORAL_TABLET | Freq: Once | ORAL | Status: DC | PRN

## 2015-03-11 MED ORDER — LACTATED RINGERS IV SOLN: INTRAVENOUS | Status: DC

## 2015-03-11 MED ORDER — LIDOCAINE HCL (CARDIAC) 20 MG/ML IV SOLN
INTRAVENOUS | Status: DC | PRN
  Administered 2015-03-11: 50 mg via INTRAVENOUS

## 2015-03-11 MED ORDER — ONDANSETRON HCL 4 MG/2ML IJ SOLN
INTRAMUSCULAR | Status: AC
Start: 2015-03-11 — End: 2015-03-11
  Filled 2015-03-11: qty 2

## 2015-03-11 MED ORDER — MEPIVACAINE HCL 1.5 % IJ SOLN
INTRAMUSCULAR | Status: DC | PRN
  Administered 2015-03-11: 10 mL via PERINEURAL

## 2015-03-11 MED ORDER — GLYCOPYRROLATE 0.2 MG/ML IJ SOLN: 0.2000 mg | Freq: Once | INTRAMUSCULAR | Status: DC | PRN

## 2015-03-11 MED ORDER — FENTANYL CITRATE (PF) 100 MCG/2ML IJ SOLN: 25.0000 ug | INTRAMUSCULAR | Status: DC | PRN

## 2015-03-11 MED ORDER — LIDOCAINE HCL (CARDIAC) 20 MG/ML IV SOLN
INTRAVENOUS | Status: AC
  Filled 2015-03-11: qty 5

## 2015-03-11 MED ORDER — OXYCODONE-ACETAMINOPHEN 5-325 MG PO TABS: 1.0000 | ORAL_TABLET | ORAL | Status: DC | PRN

## 2015-03-11 MED ORDER — OXYCODONE HCL 5 MG/5ML PO SOLN: 5.0000 mg | Freq: Once | ORAL | Status: DC | PRN

## 2015-03-11 MED ORDER — 0.9 % SODIUM CHLORIDE (POUR BTL) OPTIME
TOPICAL | Status: DC | PRN
  Administered 2015-03-11: 100 mL

## 2015-03-11 MED ORDER — ACETAMINOPHEN 160 MG/5ML PO SOLN: 325.0000 mg | ORAL | Status: DC | PRN

## 2015-03-11 MED ORDER — CEFAZOLIN SODIUM-DEXTROSE 2-3 GM-% IV SOLR
INTRAVENOUS | Status: AC
  Filled 2015-03-11: qty 50

## 2015-03-11 SURGICAL SUPPLY — 62 items
BLADE MINI RND TIP GREEN BEAV (BLADE) IMPLANT
BLADE SURG 15 STRL LF DISP TIS (BLADE) ×1 IMPLANT
BLADE SURG 15 STRL SS (BLADE) ×2
BNDG COHESIVE 4X5 TAN STRL (GAUZE/BANDAGES/DRESSINGS) ×3 IMPLANT
BNDG ESMARK 4X9 LF (GAUZE/BANDAGES/DRESSINGS) IMPLANT
BNDG GAUZE ELAST 4 BULKY (GAUZE/BANDAGES/DRESSINGS) ×6 IMPLANT
CHLORAPREP W/TINT 26ML (MISCELLANEOUS) ×3 IMPLANT
CORDS BIPOLAR (ELECTRODE) ×3 IMPLANT
COVER BACK TABLE 60X90IN (DRAPES) ×3 IMPLANT
COVER MAYO STAND STRL (DRAPES) ×3 IMPLANT
CUFF TOURNIQUET SINGLE 18IN (TOURNIQUET CUFF) IMPLANT
DECANTER SPIKE VIAL GLASS SM (MISCELLANEOUS) IMPLANT
DRAIN PENROSE 1/4X12 LTX STRL (WOUND CARE) IMPLANT
DRAPE EXTREMITY T 121X128X90 (DRAPE) ×3 IMPLANT
DRAPE SURG 17X23 STRL (DRAPES) ×3 IMPLANT
DRSG EMULSION OIL 3X3 NADH (GAUZE/BANDAGES/DRESSINGS) ×3 IMPLANT
ELECT REM PT RETURN 9FT ADLT (ELECTROSURGICAL)
ELECTRODE REM PT RTRN 9FT ADLT (ELECTROSURGICAL) IMPLANT
GAUZE SPONGE 4X4 12PLY STRL (GAUZE/BANDAGES/DRESSINGS) ×3 IMPLANT
GAUZE SPONGE 4X4 16PLY XRAY LF (GAUZE/BANDAGES/DRESSINGS) ×3 IMPLANT
GLOVE BIO SURGEON STRL SZ7.5 (GLOVE) ×3 IMPLANT
GLOVE BIOGEL PI IND STRL 7.0 (GLOVE) ×1 IMPLANT
GLOVE BIOGEL PI IND STRL 8 (GLOVE) ×1 IMPLANT
GLOVE BIOGEL PI INDICATOR 7.0 (GLOVE) ×2
GLOVE BIOGEL PI INDICATOR 8 (GLOVE) ×2
GLOVE ECLIPSE 6.5 STRL STRAW (GLOVE) ×6 IMPLANT
GLOVE SURG SS PI 7.0 STRL IVOR (GLOVE) ×3 IMPLANT
GOWN STRL REUS W/ TWL LRG LVL3 (GOWN DISPOSABLE) ×2 IMPLANT
GOWN STRL REUS W/TWL LRG LVL3 (GOWN DISPOSABLE) ×4
GOWN STRL REUS W/TWL XL LVL3 (GOWN DISPOSABLE) ×3 IMPLANT
LOOP VESSEL MAXI BLUE (MISCELLANEOUS) IMPLANT
LOOP VESSEL MINI RED (MISCELLANEOUS) IMPLANT
NEEDLE HYPO 25X1 1.5 SAFETY (NEEDLE) IMPLANT
NS IRRIG 1000ML POUR BTL (IV SOLUTION) ×3 IMPLANT
PACK BASIN DAY SURGERY FS (CUSTOM PROCEDURE TRAY) ×3 IMPLANT
PADDING CAST ABS 4INX4YD NS (CAST SUPPLIES) ×2
PADDING CAST ABS COTTON 4X4 ST (CAST SUPPLIES) ×1 IMPLANT
PENCIL BUTTON HOLSTER BLD 10FT (ELECTRODE) IMPLANT
RUBBERBAND STERILE (MISCELLANEOUS) ×3 IMPLANT
SLEEVE SCD COMPRESS KNEE MED (MISCELLANEOUS) IMPLANT
SLING ARM FOAM STRAP LRG (SOFTGOODS) ×3 IMPLANT
SPLINT PLASTER CAST XFAST 3X15 (CAST SUPPLIES) IMPLANT
SPLINT PLASTER XTRA FASTSET 3X (CAST SUPPLIES)
STOCKINETTE 6  STRL (DRAPES) ×2
STOCKINETTE 6 STRL (DRAPES) ×1 IMPLANT
SUT FIBERWIRE 2-0 18 17.9 3/8 (SUTURE)
SUT MERSILENE 4 0 P 3 (SUTURE) IMPLANT
SUT POLY BUTTON 15MM (SUTURE) IMPLANT
SUT PROLENE 6 0 P 1 18 (SUTURE) ×3 IMPLANT
SUT SILK 4 0 SH CR/8 (SUTURE) ×3 IMPLANT
SUT STEEL 4 (SUTURE) IMPLANT
SUT SUPRAMID 3-0 (SUTURE) IMPLANT
SUT VICRYL 3-0 CR8 SH (SUTURE) IMPLANT
SUT VICRYL RAPIDE 4-0 (SUTURE) IMPLANT
SUT VICRYL RAPIDE 4/0 PS 2 (SUTURE) ×6 IMPLANT
SUTURE FIBERWR 2-0 18 17.9 3/8 (SUTURE) IMPLANT
SYR BULB 3OZ (MISCELLANEOUS) ×3 IMPLANT
SYRINGE 10CC LL (SYRINGE) IMPLANT
TOWEL OR 17X24 6PK STRL BLUE (TOWEL DISPOSABLE) ×3 IMPLANT
TUBE CONNECTING 20'X1/4 (TUBING)
TUBE CONNECTING 20X1/4 (TUBING) IMPLANT
UNDERPAD 30X30 (UNDERPADS AND DIAPERS) ×3 IMPLANT

## 2015-03-11 NOTE — Progress Notes (Signed)
Assisted Dr. Moser with right, ultrasound guided, axillary block. Side rails up, monitors on throughout procedure. See vital signs in flow sheet. Tolerated Procedure well. 

## 2015-03-11 NOTE — Op Note (Signed)
03/11/2015  1:32 PM  PATIENT:  [name] [name]  40 y.o. 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 Janee Morn, MD  PHYSICIAN ASSISTANT:  Danielle Rankin, OPA-C  ANESTHESIA:  regional and MAC  SPECIMENS:  None  DRAINS:   None  EBL:  less than 50 mL  PREOPERATIVE INDICATIONS:  [name] is a  40 y.o. male with  history of right long finger traumatic injury resulting in repair of both flexor tendons. He has subsequently developed flexion contracture at the PIP joint, poor excursion of the flexor tendons, and continued altered sensibility of the tip of the digit   The risks benefits and alternatives were discussed with the patient preoperatively including but not limited to the risks of infection, bleeding, nerve injury, cardiopulmonary complications, the need for revision surgery, among others, and the patient verbalized understanding and consented to proceed.  OPERATIVE IMPLANTS: None  OPERATIVE PROCEDURE:  After receiving prophylactic antibiotics and a regional block , the patient was escorted to the operative theatre and placed in a supine position.  A surgical "time-out" was performed during which the planned procedure, proposed operative site, and the correct patient identity were compared to the operative consent and agreement confirmed by the circulating nurse according to current facility policy.  Following application of a tourniquet to the operative extremity, the exposed skin was prepped with Chloraprep and draped in the usual sterile fashion.  The limb was exsanguinated with an Esmarch bandage and the tourniquet inflated to approximately  higher than systolicthe previous incision was made sharply with a scalpel, extended proximally in the palm to allow for manipulation of the tendons following 10 a lysis. Skin flaps were retracted. The digital neurovascular bundle was found proximally and from the base of the digit all the way past the DIP flexion crease meticulous neuroplasty of both digital nerves was performed. In this manner the nerves were protected. There was tremendous scarring between the skin and the tendon sheath, and about the neurovascular bundle. Next, the flexor tendon sheath was reopened distal and proximal to the A4 pulley. The tendons were identified in 10 a lysis was performed freeing the FDS tendon to its insertion, at least a slip that had been repaired as the other slip had been excised in the original operation. The profundus tendon was intact but was scarred along its course from the PIP joint distally. In the course of performing the 10 a lysis, some of the distal portion of the tendon was divided in a way that I later repaired it with a couple simple 6-0 Prolene sutures. This was distal to the previous site of repair. In addition, the PIP flexion contracture could not be reversed with just manipulation, and so the check rein ligaments were divided radially and ulnarly and the joint manipulated. Passively the joint to get into slight hyperextension. Once satisfied with the degree of 10 a lysis, the wound was irrigated copiously and the tourniquet released. Additional hemostasis was obtained. As the skin flaps were replaced, 2 different Z-plasties were performed in efforts of avoiding the linear longitudinal scar that had previously been traumatic in origin over the distal half of the proximal phalanx and PIP joint. The skin  was closed with 4-0 Vicryl Rapide interrupted sutures. A light dressing was applied without plaster and he was taken recovery room in stable condition. The finger had become pink at the tip, with  good capillary refill following release of the tourniquet.   DISPOSITION:    He will be discharged today, beginning therapy on Thursday 3 times a week initially, out of work until I see him for his first postop visit in 10-15 days.

## 2015-03-11 NOTE — Anesthesia Postprocedure Evaluation (Signed)
Anesthesia Post Note  Patient: [name] BimlerJose Jesus Cervantes [name]  Procedure(s) Performed: Procedure(s) (LRB): RIGHT LONG FINGER Z-PLASTY, JOINT RELEASE, DIGITAL NEUROPLASTY AND FLEXOR TENOLYSIS (Right)  Patient location during evaluation: PACU Anesthesia Type: MAC and Regional Level of consciousness: awake Pain management: pain level controlled Vital Signs Assessment: post-procedure vital signs reviewed and stable Respiratory status: spontaneous breathing Cardiovascular status: stable Postop Assessment: no signs of nausea or vomiting Anesthetic complications: no    Last Vitals:  Filed Vitals:   03/11/15 1525 03/11/15 1545  BP:  132/91  Pulse: 81 80  Temp:  37.1 C  Resp: 22 14    Last Pain:  Filed Vitals:   03/11/15 1547  PainSc: 0-No pain                 Arynn Armand

## 2015-03-11 NOTE — Interval H&P Note (Signed)
History and Physical Interval Note:  03/11/2015 1:20 PM  [name] [name]  has presented today for surgery, with the diagnosis of RIGHT LONG FINGER CONTRACTURE AND FLEXOR ADHESIONS S61.219D, M24.541  The various methods of treatment have been discussed with the patient and family. After consideration of risks, benefits and other options for treatment, the patient has consented to  Procedure(s) with comments: RIGHT LONG FINGER Z-PLASTY, JOINT RELEASE, DIGITAL NEUROPLASTY AND FLEXOR TENOLYSIS (Right) - ANESTHESIA WITH PRE-OP BLOCK as a surgical intervention .  The patient's history has been reviewed, patient examined, no change in status, stable for surgery.  I have reviewed the patient's chart and labs.  Questions were answered to the patient's satisfaction.     Sibbie Flammia A.

## 2015-03-11 NOTE — Interval H&P Note (Signed)
History and Physical Interval Note:  03/11/2015 11:39 AM  [name]  has presented today for surgery, with the diagnosis of RIGHT LONG FINGER CONTRACTURE AND FLEXOR ADHESIONS S61.219D, M24.541  The various methods of treatment have been discussed with the patient and family. After consideration of risks, benefits and other options for treatment, the patient has consented to  Procedure(s) with comments: RIGHT LONG FINGER Z-PLASTY, JOINT RELEASE, DIGITAL NEUROPLASTY AND FLEXOR TENOLYSIS (Right) - ANESTHESIA WITH PRE-OP BLOCK as a surgical intervention .  The patient's history has been reviewed, patient examined, no change in status, stable for surgery.  I have reviewed the patient's chart and labs.  Questions were answered to the patient's satisfaction.     Avyaan Summer A.

## 2015-03-11 NOTE — Discharge Instructions (Addendum)
Discharge Instructions   You have a light dressing on your hand.  You may begin gentle motion of your fingers and hand immediately, but you should not do any heavy lifting or gripping.  Elevate your hand to reduce pain & swelling of the digits.  Ice over the operative site may be helpful to reduce pain & swelling.  DO NOT USE HEAT. Pain medicine has been prescribed for you.  Use your medicine as needed over the first 48 hours, and then you can begin to taper your use. You may use Tylenol in place of your prescribed pain medication, but not IN ADDITION to it. Leave the dressing in place until the third day after your surgery and then remove it, leaving it open to air.  After the bandage has been removed you may shower, regularly washing the incision and letting the water run over it, but not submerging it (no swimming, soaking it in dishwater, etc.) You may drive a car when you are off of prescription pain medications and can safely control your vehicle with both hands. Our office will contact you will follow up arrangements. You will need to begin therapy on Thursday or Friday of this week. Please contact our office if you do not hear from [place] by tomorrow to arrange this.   Please call [phone] during normal business hours or [phone] after hours for any problems. Including the following:  - excessive redness of the incisions - drainage for more than 4 days - fever of more than 101.5 F  *Please note that pain medications will not be refilled after hours or on weekends.  WORK STATUS: NO WORK UNTIL AT LEAST THE FIRST POSTOP VISIT IN 10-15 DAYS  Regional Anesthesia Blocks  1. Numbness or the inability to move the "blocked" extremity may last from 3-48 hours after placement. The length of time depends on the medication injected and your individual response to the medication. If the numbness is not going away after 48 hours, call your surgeon.  2. The extremity that is blocked will need  to be protected until the numbness is gone and the  Strength has returned. Because you cannot feel it, you will need to take extra care to avoid injury. Because it may be weak, you may have difficulty moving it or using it. You may not know what position it is in without looking at it while the block is in effect.  3. For blocks in the legs and feet, returning to weight bearing and walking needs to be done carefully. You will need to wait until the numbness is entirely gone and the strength has returned. You should be able to move your leg and foot normally before you try and bear weight or walk. You will need someone to be with you when you first try to ensure you do not fall and possibly risk injury.  4. Bruising and tenderness at the needle site are common side effects and will resolve in a few days.  5. Persistent numbness or new problems with movement should be communicated to the surgeon or the [facility] [phone] [facility] [phone]).  Post Anesthesia Home Care Instructions  Activity: Get plenty of rest for the remainder of the day. A responsible adult should stay with you for 24 hours following the procedure.  For the next 24 hours, DO NOT: -Drive a car -Advertising copywriter -Drink alcoholic beverages -Take any medication unless instructed by your physician -Make any legal decisions or sign  important papers.  Meals: Start with liquid foods such as gelatin or soup. Progress to regular foods as tolerated. Avoid greasy, spicy, heavy foods. If nausea and/or vomiting occur, drink only clear liquids until the nausea and/or vomiting subsides. Call your physician if vomiting continues.  Special Instructions/Symptoms: Your throat may feel dry or sore from the anesthesia or the breathing tube placed in your throat during surgery. If this causes discomfort, gargle with warm salt water. The discomfort should disappear within 24 hours.  If you had a scopolamine  patch placed behind your ear for the management of post- operative nausea and/or vomiting:  1. The medication in the patch is effective for 72 hours, after which it should be removed.  Wrap patch in a tissue and discard in the trash. Wash hands thoroughly with soap and water. 2. You may remove the patch earlier than 72 hours if you experience unpleasant side effects which may include dry mouth, dizziness or visual disturbances. 3. Avoid touching the patch. Wash your hands with soap and water after contact with the patch.

## 2015-03-11 NOTE — Transfer of Care (Signed)
Immediate Anesthesia Transfer of Care Note  Patient: [name] [name]  Procedure(s) Performed: Procedure(s) with comments: RIGHT LONG FINGER Z-PLASTY, JOINT RELEASE, DIGITAL NEUROPLASTY AND FLEXOR TENOLYSIS (Right) - ANESTHESIA WITH PRE-OP BLOCK  Patient Location: PACU  Anesthesia Type:MAC combined with regional for post-op pain  Level of Consciousness: awake, alert  and oriented  Airway & Oxygen Therapy: Patient Spontanous Breathing and Patient connected to face mask oxygen  Post-op Assessment: Report given to RN and Post -op Vital signs reviewed and stable  Post vital signs: Reviewed and stable  Last Vitals:  Filed Vitals:   03/11/15 1506 03/11/15 1507  BP:    Pulse: 94 90  Temp:    Resp:  13    Complications: No apparent anesthesia complications

## 2015-03-11 NOTE — Anesthesia Preprocedure Evaluation (Signed)
Anesthesia Evaluation  Patient identified by MRN, date of birth, ID band Patient awake    Reviewed: Allergy & Precautions, NPO status , Patient's Chart, lab work & pertinent test results  History of Anesthesia Complications Negative for: history of anesthetic complications  Airway Mallampati: I  TM Distance: >3 FB Neck ROM: Full    Dental  (+) Teeth Intact   Pulmonary neg shortness of breath, neg sleep apnea, neg COPD, neg recent URI, former smoker,    breath sounds clear to auscultation       Cardiovascular negative cardio ROS   Rhythm:Regular     Neuro/Psych negative neurological ROS  negative psych ROS   GI/Hepatic negative GI ROS, Neg liver ROS,   Endo/Other  negative endocrine ROS  Renal/GU negative Renal ROS     Musculoskeletal negative musculoskeletal ROS (+)   Abdominal   Peds  Hematology   Anesthesia Other Findings   Reproductive/Obstetrics                             Anesthesia Physical Anesthesia Plan  ASA: I  Anesthesia Plan: MAC and Regional   Post-op Pain Management:    Induction: Intravenous  Airway Management Planned: Nasal Cannula, Natural Airway and Simple Face Mask  Additional Equipment: None  Intra-op Plan:   Post-operative Plan:   Informed Consent: I have reviewed the patients History and Physical, chart, labs and discussed the procedure including the risks, benefits and alternatives for the proposed anesthesia with the patient or authorized representative who has indicated his/her understanding and acceptance.   Dental advisory given  Plan Discussed with: CRNA and Surgeon  Anesthesia Plan Comments:         Anesthesia Quick Evaluation

## 2015-03-11 NOTE — Anesthesia Procedure Notes (Signed)
Anesthesia Regional Block:  Axillary brachial plexus block  Pre-Anesthetic Checklist: ,, timeout performed, Correct Patient, Correct Site, Correct Laterality, Correct Procedure, Correct Position, site marked, Risks and benefits discussed, Surgical consent,  Pre-op evaluation,  At surgeon's request  Laterality: Upper and Right  Prep: chloraprep       Needles:  Injection technique: Single-shot  Needle Type: Echogenic Needle          Additional Needles:  Procedures: ultrasound guided (picture in chart) Axillary brachial plexus block Narrative:  Injection made incrementally with aspirations every 5 mL.  Performed by: Personally   Additional Notes: H+P and labs reviewed, risks and benefits discussed with patient, procedure tolerated well without complications    

## 2015-03-12 ENCOUNTER — Encounter (HOSPITAL_BASED_OUTPATIENT_CLINIC_OR_DEPARTMENT_OTHER): Payer: Self-pay | Admitting: Orthopedic Surgery

## 2015-05-29 ENCOUNTER — Encounter (HOSPITAL_COMMUNITY): Payer: Self-pay | Admitting: Emergency Medicine

## 2015-05-29 ENCOUNTER — Emergency Department (HOSPITAL_COMMUNITY)
Admission: EM | Admit: 2015-05-29 | Discharge: 2015-05-29 | Disposition: A | Discharge status: Home / Self Care | Payer: Self-pay | Attending: Emergency Medicine | Admitting: Emergency Medicine

## 2015-05-29 DIAGNOSIS — K6289 Other specified diseases of anus and rectum: Secondary | ICD-10-CM | POA: Insufficient documentation

## 2015-05-29 NOTE — ED Notes (Addendum)
Rectal pain since Friday, no bleeding. Hx of rectal abscess requiring surgery 3 years ago. Pt took 5mg  percocet 3 hours ago.

## 2015-05-30 ENCOUNTER — Observation Stay (HOSPITAL_COMMUNITY): Payer: Self-pay | Admitting: Anesthesiology

## 2015-05-30 ENCOUNTER — Observation Stay (HOSPITAL_COMMUNITY)
Admission: EM | Admit: 2015-05-30 | Discharge: 2015-05-31 | Disposition: A | Discharge status: Home / Self Care | Payer: Self-pay | Attending: Surgery | Admitting: Surgery

## 2015-05-30 ENCOUNTER — Encounter (HOSPITAL_COMMUNITY): Payer: Self-pay

## 2015-05-30 ENCOUNTER — Encounter (HOSPITAL_COMMUNITY)
Admission: EM | Disposition: A | Discharge status: Home / Self Care | Payer: Self-pay | Source: Home / Self Care | Attending: Emergency Medicine

## 2015-05-30 ENCOUNTER — Emergency Department (HOSPITAL_COMMUNITY): Payer: Self-pay

## 2015-05-30 DIAGNOSIS — K611 Rectal abscess: Principal | ICD-10-CM | POA: Diagnosis present

## 2015-05-30 DIAGNOSIS — K6289 Other specified diseases of anus and rectum: Secondary | ICD-10-CM | POA: Insufficient documentation

## 2015-05-30 DIAGNOSIS — K610 Anal abscess: Secondary | ICD-10-CM | POA: Insufficient documentation

## 2015-05-30 DIAGNOSIS — J9811 Atelectasis: Secondary | ICD-10-CM | POA: Insufficient documentation

## 2015-05-30 DIAGNOSIS — Z87891 Personal history of nicotine dependence: Secondary | ICD-10-CM | POA: Insufficient documentation

## 2015-05-30 HISTORY — PX: INCISION AND DRAINAGE PERIRECTAL ABSCESS: SHX1804

## 2015-05-30 LAB — CBC WITH DIFFERENTIAL/PLATELET
Basophils Absolute: 0 K/uL (ref 0.0–0.1)
Basophils Relative: 0 %
Eosinophils Absolute: 0.1 K/uL (ref 0.0–0.7)
Eosinophils Relative: 1 %
HCT: 44.7 % (ref 39.0–52.0)
Hemoglobin: 15.2 g/dL (ref 13.0–17.0)
Lymphocytes Relative: 17 %
Lymphs Abs: 2.1 K/uL (ref 0.7–4.0)
MCH: 30.6 pg (ref 26.0–34.0)
MCHC: 34 g/dL (ref 30.0–36.0)
MCV: 89.9 fL (ref 78.0–100.0)
Monocytes Absolute: 1 K/uL (ref 0.1–1.0)
Monocytes Relative: 8 %
Neutro Abs: 9.1 K/uL — ABNORMAL HIGH (ref 1.7–7.7)
Neutrophils Relative %: 74 %
Platelets: 332 K/uL (ref 150–400)
RBC: 4.97 MIL/uL (ref 4.22–5.81)
RDW: 12.7 % (ref 11.5–15.5)
WBC: 12.4 K/uL — ABNORMAL HIGH (ref 4.0–10.5)

## 2015-05-30 LAB — I-STAT CHEM 8, ED
BUN: 10 mg/dL (ref 6–20)
Calcium, Ion: 1.14 mmol/L (ref 1.12–1.23)
Chloride: 103 mmol/L (ref 101–111)
Creatinine, Ser: 0.6 mg/dL — ABNORMAL LOW (ref 0.61–1.24)
Glucose, Bld: 114 mg/dL — ABNORMAL HIGH (ref 65–99)
HCT: 49 % (ref 39.0–52.0)
Hemoglobin: 16.7 g/dL (ref 13.0–17.0)
Potassium: 3.6 mmol/L (ref 3.5–5.1)
Sodium: 141 mmol/L (ref 135–145)
TCO2: 26 mmol/L (ref 0–100)

## 2015-05-30 SURGERY — INCISION AND DRAINAGE, ABSCESS, PERIRECTAL
Anesthesia: General

## 2015-05-30 MED ORDER — OXYCODONE-ACETAMINOPHEN 5-325 MG PO TABS
1.0000 | ORAL_TABLET | ORAL | Status: DC | PRN
  Administered 2015-05-30 – 2015-05-31 (×4): 2 via ORAL
  Filled 2015-05-30 (×3): qty 2

## 2015-05-30 MED ORDER — FENTANYL CITRATE (PF) 250 MCG/5ML IJ SOLN
INTRAMUSCULAR | Status: AC
  Filled 2015-05-30: qty 5

## 2015-05-30 MED ORDER — FENTANYL CITRATE (PF) 100 MCG/2ML IJ SOLN
25.0000 ug | INTRAMUSCULAR | Status: DC | PRN
Start: 2015-05-30 — End: 2015-05-30
  Administered 2015-05-30 (×3): 50 ug via INTRAVENOUS

## 2015-05-30 MED ORDER — BUPIVACAINE HCL (PF) 0.25 % IJ SOLN
INTRAMUSCULAR | Status: DC | PRN
  Administered 2015-05-30: 14 mL

## 2015-05-30 MED ORDER — ONDANSETRON 4 MG PO TBDP: 4.0000 mg | ORAL_TABLET | Freq: Four times a day (QID) | ORAL | Status: DC | PRN

## 2015-05-30 MED ORDER — KCL IN DEXTROSE-NACL 20-5-0.45 MEQ/L-%-% IV SOLN: INTRAVENOUS | Status: DC

## 2015-05-30 MED ORDER — ENOXAPARIN SODIUM 40 MG/0.4ML ~~LOC~~ SOLN
40.0000 mg | SUBCUTANEOUS | Status: DC
  Filled 2015-05-30: qty 0.4

## 2015-05-30 MED ORDER — LIDOCAINE HCL (CARDIAC) 20 MG/ML IV SOLN
INTRAVENOUS | Status: DC | PRN
  Administered 2015-05-30: 80 mg via INTRAVENOUS

## 2015-05-30 MED ORDER — PROPOFOL 10 MG/ML IV BOLUS
INTRAVENOUS | Status: AC
  Filled 2015-05-30: qty 20

## 2015-05-30 MED ORDER — FENTANYL CITRATE (PF) 100 MCG/2ML IJ SOLN
100.0000 ug | Freq: Once | INTRAMUSCULAR | Status: AC
  Administered 2015-05-30: 100 ug via INTRAVENOUS
  Filled 2015-05-30: qty 2

## 2015-05-30 MED ORDER — PREDNISONE 10 MG PO TABS
10.0000 mg | ORAL_TABLET | Freq: Every day | ORAL | Status: DC
  Filled 2015-05-30: qty 1

## 2015-05-30 MED ORDER — PIPERACILLIN-TAZOBACTAM 3.375 G IVPB
3.3750 g | Freq: Three times a day (TID) | INTRAVENOUS | Status: DC
  Administered 2015-05-30 – 2015-05-31 (×3): 3.375 g via INTRAVENOUS
  Filled 2015-05-30 (×4): qty 50

## 2015-05-30 MED ORDER — FENTANYL CITRATE (PF) 100 MCG/2ML IJ SOLN
INTRAMUSCULAR | Status: AC
  Administered 2015-05-30: 50 ug via INTRAVENOUS
  Filled 2015-05-30: qty 2

## 2015-05-30 MED ORDER — LACTATED RINGERS IV SOLN
INTRAVENOUS | Status: DC
  Administered 2015-05-30: 17:00:00 via INTRAVENOUS

## 2015-05-30 MED ORDER — LACTATED RINGERS IV SOLN
INTRAVENOUS | Status: DC | PRN
  Administered 2015-05-30: 17:00:00 via INTRAVENOUS

## 2015-05-30 MED ORDER — ONDANSETRON HCL 4 MG/2ML IJ SOLN: 4.0000 mg | Freq: Four times a day (QID) | INTRAMUSCULAR | Status: DC

## 2015-05-30 MED ORDER — ONDANSETRON HCL 4 MG/2ML IJ SOLN
INTRAMUSCULAR | Status: DC | PRN
  Administered 2015-05-30: 4 mg via INTRAVENOUS

## 2015-05-30 MED ORDER — MORPHINE SULFATE (PF) 2 MG/ML IV SOLN
2.0000 mg | INTRAVENOUS | Status: DC | PRN
  Administered 2015-05-30 (×2): 4 mg via INTRAVENOUS
  Filled 2015-05-30 (×2): qty 2

## 2015-05-30 MED ORDER — BUPIVACAINE HCL (PF) 0.25 % IJ SOLN
INTRAMUSCULAR | Status: AC
  Filled 2015-05-30: qty 30

## 2015-05-30 MED ORDER — OXYCODONE-ACETAMINOPHEN 5-325 MG PO TABS
ORAL_TABLET | ORAL | Status: AC
  Filled 2015-05-30: qty 2

## 2015-05-30 MED ORDER — FENTANYL CITRATE (PF) 100 MCG/2ML IJ SOLN
INTRAMUSCULAR | Status: DC | PRN
  Administered 2015-05-30 (×4): 50 ug via INTRAVENOUS

## 2015-05-30 MED ORDER — 0.9 % SODIUM CHLORIDE (POUR BTL) OPTIME
TOPICAL | Status: DC | PRN
  Administered 2015-05-30: 1000 mL

## 2015-05-30 MED ORDER — IOPAMIDOL (ISOVUE-300) INJECTION 61%
INTRAVENOUS | Status: AC
Start: 2015-05-30 — End: 2015-05-30
  Administered 2015-05-30: 100 mL
  Filled 2015-05-30: qty 100

## 2015-05-30 MED ORDER — HYDROMORPHONE HCL 1 MG/ML IJ SOLN
1.0000 mg | Freq: Once | INTRAMUSCULAR | Status: AC
  Administered 2015-05-30: 1 mg via INTRAVENOUS
  Filled 2015-05-30: qty 1

## 2015-05-30 MED ORDER — ONDANSETRON HCL 4 MG/2ML IJ SOLN: 4.0000 mg | Freq: Four times a day (QID) | INTRAMUSCULAR | Status: DC | PRN

## 2015-05-30 MED ORDER — KCL IN DEXTROSE-NACL 20-5-0.45 MEQ/L-%-% IV SOLN
INTRAVENOUS | Status: AC
  Filled 2015-05-30: qty 1000

## 2015-05-30 MED ORDER — MIDAZOLAM HCL 2 MG/2ML IJ SOLN
INTRAMUSCULAR | Status: AC
  Filled 2015-05-30: qty 2

## 2015-05-30 MED ORDER — MEPERIDINE HCL 25 MG/ML IJ SOLN: 6.2500 mg | INTRAMUSCULAR | Status: DC | PRN

## 2015-05-30 MED ORDER — CLINDAMYCIN PHOSPHATE 600 MG/50ML IV SOLN
600.0000 mg | Freq: Once | INTRAVENOUS | Status: AC
  Administered 2015-05-30: 600 mg via INTRAVENOUS
  Filled 2015-05-30: qty 50

## 2015-05-30 MED ORDER — MIDAZOLAM HCL 5 MG/5ML IJ SOLN
INTRAMUSCULAR | Status: DC | PRN
  Administered 2015-05-30: 2 mg via INTRAVENOUS

## 2015-05-30 MED ORDER — PROPOFOL 10 MG/ML IV BOLUS
INTRAVENOUS | Status: DC | PRN
  Administered 2015-05-30: 200 mg via INTRAVENOUS

## 2015-05-30 MED ORDER — SUCCINYLCHOLINE CHLORIDE 20 MG/ML IJ SOLN
INTRAMUSCULAR | Status: DC | PRN
  Administered 2015-05-30: 100 mg via INTRAVENOUS

## 2015-05-30 SURGICAL SUPPLY — 34 items
CANISTER SUCTION 2500CC (MISCELLANEOUS) ×2 IMPLANT
CLEANER TIP ELECTROSURG 2X2 (MISCELLANEOUS) IMPLANT
COVER SURGICAL LIGHT HANDLE (MISCELLANEOUS) ×2 IMPLANT
DRAIN PENROSE 1/4X12 LTX STRL (WOUND CARE) ×2 IMPLANT
DRAPE UTILITY XL STRL (DRAPES) ×4 IMPLANT
DRSG PAD ABDOMINAL 8X10 ST (GAUZE/BANDAGES/DRESSINGS) ×2 IMPLANT
ELECT REM PT RETURN 9FT ADLT (ELECTROSURGICAL)
ELECTRODE REM PT RTRN 9FT ADLT (ELECTROSURGICAL) IMPLANT
GAUZE PACKING IODOFORM 1 (PACKING) IMPLANT
GAUZE SPONGE 4X4 12PLY STRL (GAUZE/BANDAGES/DRESSINGS) ×2 IMPLANT
GAUZE SPONGE 4X4 16PLY XRAY LF (GAUZE/BANDAGES/DRESSINGS) ×2 IMPLANT
GLOVE BIO SURGEON STRL SZ8 (GLOVE) ×2 IMPLANT
GLOVE BIOGEL PI IND STRL 8 (GLOVE) ×1 IMPLANT
GLOVE BIOGEL PI INDICATOR 8 (GLOVE) ×1
GOWN STRL REUS W/ TWL LRG LVL3 (GOWN DISPOSABLE) ×2 IMPLANT
GOWN STRL REUS W/ TWL XL LVL3 (GOWN DISPOSABLE) ×1 IMPLANT
GOWN STRL REUS W/TWL LRG LVL3 (GOWN DISPOSABLE) ×2
GOWN STRL REUS W/TWL XL LVL3 (GOWN DISPOSABLE) ×1
KIT BASIN OR (CUSTOM PROCEDURE TRAY) ×2 IMPLANT
KIT ROOM TURNOVER OR (KITS) ×2 IMPLANT
NS IRRIG 1000ML POUR BTL (IV SOLUTION) ×2 IMPLANT
PACK LITHOTOMY IV (CUSTOM PROCEDURE TRAY) ×2 IMPLANT
PAD ABD 8X10 STRL (GAUZE/BANDAGES/DRESSINGS) ×2 IMPLANT
PAD ARMBOARD 7.5X6 YLW CONV (MISCELLANEOUS) ×4 IMPLANT
PENCIL BUTTON HOLSTER BLD 10FT (ELECTRODE) IMPLANT
SPONGE GAUZE 4X4 12PLY STER LF (GAUZE/BANDAGES/DRESSINGS) ×2 IMPLANT
SWAB COLLECTION DEVICE MRSA (MISCELLANEOUS) ×2 IMPLANT
TOWEL OR 17X24 6PK STRL BLUE (TOWEL DISPOSABLE) ×2 IMPLANT
TOWEL OR 17X26 10 PK STRL BLUE (TOWEL DISPOSABLE) ×2 IMPLANT
TUBE ANAEROBIC SPECIMEN COL (MISCELLANEOUS) ×2 IMPLANT
TUBE CONNECTING 12X1/4 (SUCTIONS) ×2 IMPLANT
UNDERPAD 30X30 INCONTINENT (UNDERPADS AND DIAPERS) ×2 IMPLANT
WATER STERILE IRR 1000ML POUR (IV SOLUTION) ×2 IMPLANT
YANKAUER SUCT BULB TIP NO VENT (SUCTIONS) ×2 IMPLANT

## 2015-05-30 NOTE — Transfer of Care (Signed)
Immediate Anesthesia Transfer of Care Note  Patient: [name] [name]  Procedure(s) Performed: Procedure(s): IRRIGATION AND DEBRIDEMENT PERIRECTAL ABSCESS (N/A)  Patient Location: PACU  Anesthesia Type:General  Level of Consciousness: awake, alert , oriented and patient cooperative  Airway & Oxygen Therapy: Patient Spontanous Breathing and Patient connected to nasal cannula oxygen  Post-op Assessment: Report given to RN and Post -op Vital signs reviewed and stable  Post vital signs: Reviewed and stable  Last Vitals:  Filed Vitals:   05/30/15 1530 05/30/15 1600  BP: 114/68 117/72  Pulse: 78 80  Temp:    Resp:      Complications: No apparent anesthesia complications

## 2015-05-30 NOTE — ED Notes (Signed)
Pt.s girlfriend Boone MasterLisa Medes is coming to pick up pt.s belongings in the ED

## 2015-05-30 NOTE — Op Note (Signed)
Preoperative diagnosis: Perirectal abscess  Postoperative diagnosis: Horseshoe perirectal abscess  Procedure: Incision and drainage of horseshoe perirectal abscess  Surgeon: Erroll Luna M.D.  Anesthesia: Gen. with 0.25% Sensorcaine local  EBL: Minimal  Drains: Quarter-inch Penrose drain  Indications for procedure: Patient is a 40 year old male who has had rectal pain for 1 week. CT scan showed a perirectal abscess which is a horseshoe configuration upon my inspection of this. This seemed to be left greater than right. Discussed drainage in the operating room with the patient. Risks, benefits and alternative therapies discussed with the patient. Risk of bleeding, infection, injury to nerves, injury to rectum, injure distinct or muscle, incontinence, chronic pain, the need for other additional procedures and surgeries to treat the condition, injury to the prostate, urethra, bladder, and other peritoneal structures discussed. He agreed to proceed.  Description of procedure: The patient was met in the holding area and questions answered. Taken back to the operating room and placed upon the OR table. After induction of general anesthesia, he was placed lithotomy in the perianal region was prepped and draped in sterile fashion. He received preoperative solution. Timeout was done to verify proper patient procedure. Digital examination was done. His tone was normal. I saw no evidence of seizure, fistula or significant hemorrhoid disease. He had significant fluctuance along the left anal verge which continued posteriorly toward the right. The needle was used to aspirate these areas. There is small return of pus in the left region. A 1 cm incision was made and a hemostat was placed into a large cavity. This tract posterior to the rectum from left right. I placed a counterincision to the patient's right and posterior to the rectum. Small amount of pus drained from this. Quarter-inch Penrose was placed  between the 2 secured to the skin with 3-0 nylon. Dry dressings were applied. All final counts are found to be correct. Patient was awoke extubated taken to recovery in satisfactory condition.

## 2015-05-30 NOTE — ED Notes (Addendum)
Dr. Lindie SpruceWyatt at the bedside, updating pt. On plan of care

## 2015-05-30 NOTE — H&P (Signed)
[name] [name] is an 40 y.o. male.   Chief Complaint: Rectal pain since last Friday,05/23/15 HPI: pt has hx of perirectal abscess back in 09/17/2012 with I&D by Dr. Janee Morn.   Work up in the ED shows he is afebrile, VSS.  BMP OK, WBC is 12.4.  CT scan shows:  Recurrent perirectal/perianal abscess, U-shaped and eccentric to the left similar to the 2014 lesion. The largest pocket of fluid used 20 x 28 x 36 mm. Surrounding cellulitis.   No pelvic involvement other than trace presacral stranding. Negative CT of the abdominal viscera. We are ask to see.  Past Medical History  Diagnosis Date  . 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 Adhesions, flexor or extensor tendons 03/2015    flexor adhesions right long finger    Past Surgical History  Procedure Laterality Date  . Incision and drainage perirectal abscess N/A 09/17/2012    Procedure: IRRIGATION AND DEBRIDEMENT PERIRECTAL ABSCESS;  Surgeon: Liz Malady, MD;  Location: [facility];  Service: General;  Laterality: N/A; home 09/18/12  . Flexor tendon repair Right 09/17/2014    Procedure: FLEXOR TENDON REPAIR right long finger ;  Surgeon: Mack Hook, MD;  Location: [facility];  Service: Orthopedics;  Laterality: Right;  . Tenolysis Right 03/11/2015    Procedure: RIGHT LONG FINGER Z-PLASTY, JOINT RELEASE, DIGITAL NEUROPLASTY AND FLEXOR TENOLYSIS;  Surgeon: Mack Hook, MD;  Location: [facility];  Service: Orthopedics;  Laterality: Right;  ANESTHESIA WITH PRE-OP BLOCK    No family history on file. Social History:  reports that he quit smoking about 6 years ago. He has never used smokeless tobacco. He reports that he drinks alcohol. He reports that he does not use illicit drugs.  Allergies: No Known Allergies  Prior to Admission medications   Medication Sig Start Date End Date Taking? Authorizing Provider  ketoconazole (NIZORAL) 2 % cream Apply 1 application topically  daily.   Yes Historical Provider, MD  oxyCODONE-acetaminophen (ROXICET) 5-325 MG tablet Take 1-2 tablets by mouth every 4 (four) hours as needed. 03/11/15  Yes Mack Hook, MD  predniSONE (DELTASONE) 10 MG tablet Take 10 mg by mouth daily with breakfast. He took one on Monday and 2 tablets on Tueday and WEd.   He thought it would help with pain. Yes Historical Provider, MD  acetaminophen (TYLENOL) 500 MG tablet Take 1,000 mg by mouth every 6 (six) hours as needed.    Historical Provider, MD  ibuprofen (ADVIL,MOTRIN) 200 MG tablet Take 200 mg by mouth every 6 (six) hours as needed.    Historical Provider, MD     Results for orders placed or performed during the hospital encounter of 05/30/15 (from the past 48 hour(s))  CBC with Differential     Status: Abnormal   Collection Time: 05/30/15 10:09 AM  Result Value Ref Range   WBC 12.4 (H) 4.0 - 10.5 K/uL   RBC 4.97 4.22 - 5.81 MIL/uL   Hemoglobin 15.2 13.0 - 17.0 g/dL   HCT 16.1 09.6 - 04.5 %   MCV 89.9 78.0 - 100.0 fL   MCH 30.6 26.0 - 34.0 pg   MCHC 34.0 30.0 - 36.0 g/dL   RDW 40.9 81.1 - 91.4 %   Platelets 332 150 - 400 K/uL   Neutrophils Relative % 74 %   Neutro Abs 9.1 (H) 1.7 - 7.7 K/uL   Lymphocytes Relative 17 %   Lymphs Abs 2.1 0.7 -  4.0 K/uL   Monocytes Relative 8 %   Monocytes Absolute 1.0 0.1 - 1.0 K/uL   Eosinophils Relative 1 %   Eosinophils Absolute 0.1 0.0 - 0.7 K/uL   Basophils Relative 0 %   Basophils Absolute 0.0 0.0 - 0.1 K/uL  I-stat Chem 8, ED     Status: Abnormal   Collection Time: 05/30/15 10:17 AM  Result Value Ref Range   Sodium 141 135 - 145 mmol/L   Potassium 3.6 3.5 - 5.1 mmol/L   Chloride 103 101 - 111 mmol/L   BUN 10 6 - 20 mg/dL   Creatinine, Ser 1.610.60 (L) 0.61 - 1.24 mg/dL   Glucose, Bld 096114 (H) 65 - 99 mg/dL   Calcium, Ion 0.451.14 4.091.12 - 1.23 mmol/L   TCO2 26 0 - 100 mmol/L   Hemoglobin 16.7 13.0 - 17.0 g/dL   HCT 81.149.0 91.439.0 - 78.252.0 %   Ct Abdomen Pelvis W Contrast  05/30/2015  CLINICAL DATA:   40 year old male with rectal pain for 1 week. Personal history of perirectal abscess in 2014. Initial encounter. EXAM: CT ABDOMEN AND PELVIS WITH CONTRAST TECHNIQUE: Multidetector CT imaging of the abdomen and pelvis was performed using the standard protocol following bolus administration of intravenous contrast. CONTRAST:  100mL ISOVUE-300 IOPAMIDOL (ISOVUE-300) INJECTION 61% COMPARISON:  Chest CT 10/15/2014.  CT Abdomen and Pelvis 09/17/2012. FINDINGS: Negative lung bases other than mild dependent atelectasis on the right. No pericardial or pleural effusion. No acute osseous abnormality identified. Recurrent perirectal/perianal abscess eccentric to the left, but U-shaped overall with a smaller component to the right of midline and of moderate size component in the posterior midline. In all, the most confluent component of fluid encompasses 20 x 28 x 36 mm (AP by transverse by CC). See series 2, images 100-104 and coronal image 117. The configuration is very similar to the 2014 abscess. There is similar associated regional soft tissue thickening and stranding. Mild presacral stranding but no pelvic free fluid. No definite rectal wall thickening within the pelvis. Diminutive urinary bladder. Negative sigmoid colon, left colon, transverse colon, right colon, appendix, terminal ileum, small bowel loops, stomach and duodenum. No abdominal free air or free fluid. Liver, gallbladder, spleen, pancreas, and adrenal glands are within normal limits. Portal venous system is patent. Major arterial structures appear normal. No lymphadenopathy in the abdomen or pelvis. There is probably early renal medullary contrast excretion occurring on these images. Renal enhancement otherwise within normal limits. IMPRESSION: 1. Recurrent perirectal/perianal abscess, U-shaped and eccentric to the left similar to the 2014 lesion. The largest pocket of fluid used 20 x 28 x 36 mm. Surrounding cellulitis. 2. No pelvic involvement other than  trace presacral stranding. 3. Negative CT of the abdominal viscera. Electronically Signed   By: Odessa FlemingH  Hall M.D.   On: 05/30/2015 12:12    Review of Systems  Constitutional: Negative.   All other systems reviewed and are negative.   Blood pressure 121/78, pulse 79, temperature 98.2 F (36.8 C), temperature source Oral, resp. rate 18, height 5\' 5"  (1.651 m), weight 80.74 kg (178 lb), SpO2 99 %. Physical Exam  Constitutional: He is oriented to person, place, and time. He appears well-developed and well-nourished. No distress.  HENT:  Head: Normocephalic.  Mouth/Throat: No oropharyngeal exudate.  Eyes: Right eye exhibits no discharge. Left eye exhibits no discharge. No scleral icterus.  Neck: Normal range of motion. Neck supple. No JVD present. No tracheal deviation present. No thyromegaly present.  Cardiovascular: Normal rate, regular rhythm, normal  heart sounds and intact distal pulses.   No murmur heard. Respiratory: Effort normal and breath sounds normal. No respiratory distress. He has no wheezes. He has no rales. He exhibits no tenderness.  GI: Soft. Bowel sounds are normal. He exhibits no distension and no mass. There is no tenderness. There is no rebound and no guarding.  BM x 2 yesterday, first was painful, second was not  Genitourinary:     Musculoskeletal: He exhibits no edema or tenderness.  Lymphadenopathy:    He has no cervical adenopathy.  Neurological: He is alert and oriented to person, place, and time. No cranial nerve deficit.  Skin: Skin is warm and dry. No rash noted. He is not diaphoretic. No erythema. No pallor.  Some drainage from the rectum.  Psychiatric: He has a normal mood and affect. His behavior is normal. Judgment and thought content normal.     Assessment/Plan Recurrent perirectal abscess Some steroid exposure   Plan:  Admit to observation, antibiotics, I&D  Kelty Szafran, PA-C 05/30/2015, 1:13 PM

## 2015-05-30 NOTE — Progress Notes (Signed)
Patient admitted to 6N31 from PACU s/p I&D of perirectal abscess. Alert and oriented x4, VSS. Oriented to room and call button.

## 2015-05-30 NOTE — ED Provider Notes (Signed)
CSN: [id number]     Arrival date & time 05/30/15  0920 History   First MD Initiated Contact with Patient 05/30/15 [id number]     Chief Complaint  Patient presents with  . Abscess     (Consider location/radiation/quality/duration/timing/severity/associated sxs/prior Treatment) HPI  [facility] Gwenyth Ober Lily Kocher is a 40 y.o M with a pmhx of perirectal abscess who presents to the ED today c/o rectal pain. Pt states that last Friday he began having pain in his rectum that has gotten progressively worse. Pt states that he now has pain with sitting and has very painful bowel movements. Pt reports similar symptoms 3 years ago when he had a perirectal abscess that required surgery. Pt has been taking home percocet with mild relief of his symptoms. Last dose was 2 hours PTA. Denies abdominal pain, melena, hematochezia, N/V, fevers, chills.    Past Medical History  Diagnosis Date  . 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 Adhesions, flexor or extensor tendons 03/2015    flexor adhesions right long finger   Past Surgical History  Procedure Laterality Date  . Incision and drainage perirectal abscess N/A 09/17/2012    Procedure: IRRIGATION AND DEBRIDEMENT PERIRECTAL ABSCESS;  Surgeon: Liz Malady, MD;  Location: [facility] OR;  Service: General;  Laterality: N/A;  . Flexor tendon repair Right 09/17/2014    Procedure: FLEXOR TENDON REPAIR right long finger ;  Surgeon: Mack Hook, MD;  Location: [facility];  Service: Orthopedics;  Laterality: Right;  . Tenolysis Right 03/11/2015    Procedure: RIGHT LONG FINGER Z-PLASTY, JOINT RELEASE, DIGITAL NEUROPLASTY AND FLEXOR TENOLYSIS;  Surgeon: Mack Hook, MD;  Location: [facility];  Service: Orthopedics;  Laterality: Right;  ANESTHESIA WITH PRE-OP BLOCK   No family history on file. Social History  Substance Use Topics  . Smoking status: Former Smoker    Quit date: 02/28/2009  . Smokeless tobacco: Never  Used  . Alcohol Use: Yes     Comment: occasionally    Review of Systems  All other systems reviewed and are negative.     Allergies  Review of patient's allergies indicates no known allergies.  Home Medications   Prior to Admission medications   Medication Sig Start Date End Date Taking? Authorizing Provider  acetaminophen (TYLENOL) 500 MG tablet Take 1,000 mg by mouth every 6 (six) hours as needed.    Historical Provider, MD  ibuprofen (ADVIL,MOTRIN) 200 MG tablet Take 200 mg by mouth every 6 (six) hours as needed.    Historical Provider, MD  oxyCODONE-acetaminophen (ROXICET) 5-325 MG tablet Take 1-2 tablets by mouth every 4 (four) hours as needed. 03/11/15   Mack Hook, MD   BP 170/110 mmHg  Pulse 92  Temp(Src) 98.2 F (36.8 C) (Oral)  Resp 18  Ht  (1.651 m)  Wt 80.74 kg  BMI 29.62 kg/m2  SpO2 100% Physical Exam  Constitutional: He is oriented to person, place, and time. He appears well-developed and well-nourished. No distress.  HENT:  Head: Normocephalic and atraumatic.  Eyes: Conjunctivae are normal. Right eye exhibits no discharge. Left eye exhibits no discharge. No scleral icterus.  Cardiovascular: Normal rate.   Pulmonary/Chest: Effort normal.  Abdominal: Soft. Bowel sounds are normal. He exhibits no distension and no mass. There is no tenderness. There is no rebound and no guarding.  Genitourinary:  Significant tenderness on rectal exam. No external hemorrhoids or fluctuance seen. No gross blood. Good rectal tone.  Neurological: He is alert and oriented to person, place, and time. Coordination normal.  Skin: Skin is warm and dry. No rash noted. He is not diaphoretic. No erythema. No pallor.  Psychiatric: He has a normal mood and affect. His behavior is normal.  Nursing note and vitals reviewed.   ED Course  Procedures (including critical care time) Labs Review Labs Reviewed  CBC WITH DIFFERENTIAL/PLATELET - Abnormal; Notable for the following:     WBC 12.4 (*)    Neutro Abs 9.1 (*)    All other components within normal limits  I-STAT CHEM 8, ED - Abnormal; Notable for the following:    Creatinine, Ser 0.60 (*)    Glucose, Bld 114 (*)    All other components within normal limits    Imaging Review Ct Abdomen Pelvis W Contrast  05/30/2015  CLINICAL DATA:  40 year old male with rectal pain for 1 week. Personal history of perirectal abscess in 2014. Initial encounter. EXAM: CT ABDOMEN AND PELVIS WITH CONTRAST TECHNIQUE: Multidetector CT imaging of the abdomen and pelvis was performed using the standard protocol following bolus administration of intravenous contrast. CONTRAST:  100mL ISOVUE-300 IOPAMIDOL (ISOVUE-300) INJECTION 61% COMPARISON:  Chest CT 10/15/2014.  CT Abdomen and Pelvis 09/17/2012. FINDINGS: Negative lung bases other than mild dependent atelectasis on the right. No pericardial or pleural effusion. No acute osseous abnormality identified. Recurrent perirectal/perianal abscess eccentric to the left, but U-shaped overall with a smaller component to the right of midline and of moderate size component in the posterior midline. In all, the most confluent component of fluid encompasses 20 x 28 x 36 mm (AP by transverse by CC). See series 2, images 100-104 and coronal image 117. The configuration is very similar to the 2014 abscess. There is similar associated regional soft tissue thickening and stranding. Mild presacral stranding but no pelvic free fluid. No definite rectal wall thickening within the pelvis. Diminutive urinary bladder. Negative sigmoid colon, left colon, transverse colon, right colon, appendix, terminal ileum, small bowel loops, stomach and duodenum. No abdominal free air or free fluid. Liver, gallbladder, spleen, pancreas, and adrenal glands are within normal limits. Portal venous system is patent. Major arterial structures appear normal. No lymphadenopathy in the abdomen or pelvis. There is probably early renal medullary  contrast excretion occurring on these images. Renal enhancement otherwise within normal limits. IMPRESSION: 1. Recurrent perirectal/perianal abscess, U-shaped and eccentric to the left similar to the 2014 lesion. The largest pocket of fluid used 20 x 28 x 36 mm. Surrounding cellulitis. 2. No pelvic involvement other than trace presacral stranding. 3. Negative CT of the abdominal viscera. Electronically Signed   By: Odessa FlemingH  Hall M.D.   On: 05/30/2015 12:12   I have personally reviewed and evaluated these images and lab results as part of my medical decision-making.   EKG Interpretation None      MDM   Final diagnoses:  Perirectal abscess    Patient here with recurrent perirectal/perianal abscess seen on CT. This is similar to the 2014 lesion. Largest pocket of fluid measures 202836 mm. Surrounding cellulitis present. Mild leukocytosis present. WBC 12.4. Pt given IV dilaudid and clindamycin. Will consult general surgery.  Spoke with general surgery who will bring pt to OR. Patient was discussed with and seen by Dr. Adriana Simasook who agrees with the treatment plan.      Lester KinsmanSamantha Tripp [place], PA-C 05/30/15 1323  Donnetta HutchingBrian Cook, MD 05/30/15 1535

## 2015-05-30 NOTE — Anesthesia Preprocedure Evaluation (Signed)
Anesthesia Evaluation  Patient identified by MRN, date of birth, ID band Patient awake    Reviewed: Allergy & Precautions, NPO status , Patient's Chart, lab work & pertinent test results  History of Anesthesia Complications Negative for: history of anesthetic complications  Airway Mallampati: I  TM Distance: >3 FB Neck ROM: Full    Dental  (+) Teeth Intact   Pulmonary neg shortness of breath, neg sleep apnea, neg COPD, neg recent URI, former smoker,    breath sounds clear to auscultation       Cardiovascular negative cardio ROS   Rhythm:Regular     Neuro/Psych negative neurological ROS  negative psych ROS   GI/Hepatic negative GI ROS, Neg liver ROS,   Endo/Other  negative endocrine ROS  Renal/GU negative Renal ROS     Musculoskeletal negative musculoskeletal ROS (+)   Abdominal   Peds  Hematology 16/49   Anesthesia Other Findings   Reproductive/Obstetrics                             Anesthesia Physical Anesthesia Plan  ASA: II and emergent  Anesthesia Plan: General   Post-op Pain Management:    Induction: Intravenous  Airway Management Planned: Oral ETT  Additional Equipment:   Intra-op Plan:   Post-operative Plan: Extubation in OR  Informed Consent: I have reviewed the patients History and Physical, chart, labs and discussed the procedure including the risks, benefits and alternatives for the proposed anesthesia with the patient or authorized representative who has indicated his/her understanding and acceptance.     Plan Discussed with:   Anesthesia Plan Comments:         Anesthesia Quick Evaluation

## 2015-05-30 NOTE — Interval H&P Note (Signed)
History and Physical Interval Note:  05/30/2015 5:02 PM  [facility] Inc[facility] Jesus Janae SauceCervantes [name]  has presented today for surgery, with the diagnosis of Recurrent perirectal abscess  The various methods of treatment have been discussed with the patient and family. After consideration of risks, benefits and other options for treatment, the patient has consented to  Procedure(s): IRRIGATION AND DEBRIDEMENT PERIRECTAL ABSCESS (N/A) as a surgical intervention .  The patient's history has been reviewed, patient examined, no change in status, stable for surgery.  I have reviewed the patient's chart and labs.  Questions were answered to the patient's satisfaction.   I  Have seen and examined the patient. He may have a horseshoe abscess and requires draining. I have explained the case to him and potential risks of bleeding  Infection recurrence incontinence and damage to near by structures of prostate  Urethra bladder pelvic nerves blood clots stroke death  Remijio Holleran A.

## 2015-05-30 NOTE — ED Notes (Signed)
Patient denies pain and is resting comfortably.  

## 2015-05-30 NOTE — ED Notes (Signed)
Informed the RN on [address] that the pt. Was going to OR.

## 2015-05-30 NOTE — ED Notes (Signed)
Suture cart at bedside 

## 2015-05-30 NOTE — ED Notes (Signed)
Pt. Was here yesterday but was unable to stay.  He came back today due to rectal pain,. Pt. Can feel 2 areas that are swollen and painful.  Pt. Has had surgery in this area for abscess.  He denies any blood or drainage note.  Pt. Denies any fevers.

## 2015-05-30 NOTE — ED Notes (Signed)
Pt.s girlfriend never arrived to pick up pt's belongings, pants shirt, phone , and medication.  Pt.s belongings taken to [id number] by our Medical Secretary, Selena BattenKim .

## 2015-05-30 NOTE — Anesthesia Procedure Notes (Signed)
Procedure Name: Intubation Date/Time: 05/30/2015 5:47 PM Performed by: Dairl PonderJIANG, Makell Cyr Pre-anesthesia Checklist: Patient identified, Timeout performed, Emergency Drugs available, Suction available and Patient being monitored Patient Re-evaluated:Patient Re-evaluated prior to inductionOxygen Delivery Method: Circle system utilized Preoxygenation: Pre-oxygenation with 100% oxygen Intubation Type: IV induction Ventilation: Mask ventilation without difficulty Laryngoscope Size: Mac and 3 Grade View: Grade II Tube type: Oral Tube size: 7.5 mm Number of attempts: 1 Airway Equipment and Method: Stylet Placement Confirmation: ETT inserted through vocal cords under direct vision,  breath sounds checked- equal and bilateral and positive ETCO2 Secured at: 23 cm Tube secured with: Tape Dental Injury: Teeth and Oropharynx as per pre-operative assessment

## 2015-05-31 MED ORDER — AMOXICILLIN-POT CLAVULANATE 875-125 MG PO TABS: 1.0000 | ORAL_TABLET | Freq: Two times a day (BID) | ORAL | Status: DC

## 2015-05-31 MED ORDER — OXYCODONE-ACETAMINOPHEN 5-325 MG PO TABS: 1.0000 | ORAL_TABLET | ORAL | Status: AC | PRN

## 2015-05-31 MED ORDER — SACCHAROMYCES BOULARDII 250 MG PO CAPS: ORAL_CAPSULE | ORAL | Status: AC

## 2015-05-31 MED ORDER — SACCHAROMYCES BOULARDII 250 MG PO CAPS: 250.0000 mg | ORAL_CAPSULE | Freq: Two times a day (BID) | ORAL | Status: DC

## 2015-05-31 NOTE — Progress Notes (Signed)
1 Day Post-Op  Subjective: He is pretty sore, but OK, site looks fine.   Objective: Vital signs in last 24 hours: Temp:  [97.7 F (36.5 C)-99.3 F (37.4 C)] 98.6 F (37 C) (04/01 0640) Pulse Rate:  [66-97] 66 (04/01 0640) Resp:  [13-24] 18 (04/01 0640) BP: (103-170)/(60-110) 104/65 mmHg (04/01 0640) SpO2:  [95 %-100 %] 99 % (04/01 0640) Weight:  [78.4 kg (172 lb 13.5 oz)-80.74 kg (178 lb)] 78.4 kg (172 lb 13.5 oz) (03/31 2015) Last BM Date: 05/29/15 Afebrile, VSS No labs    Intake/Output from previous day: 03/31 0701 - 04/01 0700 In: 500 [I.V.:500] Out: 1160 [Urine:1150; Blood:10] Intake/Output this shift:    General appearance: alert, cooperative and no distress Skin:  No cellulitis, site looks fine, minimal drainage on the dressing.  Lab Results:   Recent Labs  05/30/15 1009 05/30/15 1017  WBC 12.4*  --   HGB 15.2 16.7  HCT 44.7 49.0  PLT 332  --     BMET  Recent Labs  05/30/15 1017  NA 141  K 3.6  CL 103  GLUCOSE 114*  BUN 10  CREATININE 0.60*   PT/INR No results for input(s): LABPROT, INR in the last 72 hours.  No results for input(s): AST, ALT, ALKPHOS, BILITOT, PROT, ALBUMIN in the last 168 hours.   Lipase     Component Value Date/Time   LIPASE 30 10/15/2014 1626     Studies/Results: Ct Abdomen Pelvis W Contrast  05/30/2015  CLINICAL DATA:  40 year old male with rectal pain for 1 week. Personal history of perirectal abscess in 2014. Initial encounter. EXAM: CT ABDOMEN AND PELVIS WITH CONTRAST TECHNIQUE: Multidetector CT imaging of the abdomen and pelvis was performed using the standard protocol following bolus administration of intravenous contrast. CONTRAST:  100mL ISOVUE-300 IOPAMIDOL (ISOVUE-300) INJECTION 61% COMPARISON:  Chest CT 10/15/2014.  CT Abdomen and Pelvis 09/17/2012. FINDINGS: Negative lung bases other than mild dependent atelectasis on the right. No pericardial or pleural effusion. No acute osseous abnormality identified.  Recurrent perirectal/perianal abscess eccentric to the left, but U-shaped overall with a smaller component to the right of midline and of moderate size component in the posterior midline. In all, the most confluent component of fluid encompasses 20 x 28 x 36 mm (AP by transverse by CC). See series 2, images 100-104 and coronal image 117. The configuration is very similar to the 2014 abscess. There is similar associated regional soft tissue thickening and stranding. Mild presacral stranding but no pelvic free fluid. No definite rectal wall thickening within the pelvis. Diminutive urinary bladder. Negative sigmoid colon, left colon, transverse colon, right colon, appendix, terminal ileum, small bowel loops, stomach and duodenum. No abdominal free air or free fluid. Liver, gallbladder, spleen, pancreas, and adrenal glands are within normal limits. Portal venous system is patent. Major arterial structures appear normal. No lymphadenopathy in the abdomen or pelvis. There is probably early renal medullary contrast excretion occurring on these images. Renal enhancement otherwise within normal limits. IMPRESSION: 1. Recurrent perirectal/perianal abscess, U-shaped and eccentric to the left similar to the 2014 lesion. The largest pocket of fluid used 20 x 28 x 36 mm. Surrounding cellulitis. 2. No pelvic involvement other than trace presacral stranding. 3. Negative CT of the abdominal viscera. Electronically Signed   By: [name] [name] M.D.   On: 05/30/2015 12:12    Medications: . enoxaparin (LOVENOX) injection  40 mg Subcutaneous Q24H  . piperacillin-tazobactam (ZOSYN)  IV  3.375 g Intravenous 3 times per day  .  predniSONE  10 mg Oral Q breakfast    Assessment/Plan Horseshoe perirectal abscess   Incision and drainage of horseshoe perirectal abscess, 05/30/15, Dr. Harriette Bouillon Antibiotics:  Zosyn day 2 DVT:  Lovenox  Plan:  Home today with drain in place, he will follow up in the office.  10 days of  Augmentin/probiotic.       [name],[name] 05/31/2015 [phone]  Agree with above.  Ovidio Kin, MD, [facility] Surgery Pager: [phone] Office phone:  [phone]

## 2015-05-31 NOTE — Discharge Instructions (Signed)
Perirectal Abscess An abscess is an infected area that contains a collection of pus. A perirectal abscess is an abscess that is near the opening of the anus or around the rectum. A perirectal abscess can cause a lot of pain, especially during bowel movements. CAUSES This condition is almost always caused by an infection that starts in an anal gland. RISK FACTORS This condition is more likely to develop in:  People with diabetes or inflammatory bowel disease.  People whose body defense system (immune system) is weak.  People who have anal sex.  People who have a sexually transmitted disease (STD).  People who have certain kinds of cancers, such as rectal carcinoma, leukemia, or lymphoma. SYMPTOMS The main symptom of this condition is pain. The pain may be a throbbing pain that gets worse during bowel movements. Other symptoms include: 1. Fever. 2. Swelling. 3. Redness. 4. Bleeding. 5. Constipation. DIAGNOSIS The condition is diagnosed with a physical exam. If the abscess is not visible, a health care provider may need to place a finger inside the rectum to find the abscess. Sometimes, imaging tests are done to determine the size and location of the abscess. These tests may include:  An ultrasound.  An MRI.  A CT scan. TREATMENT This condition is usually treated with incision and drainage surgery. Incision and drainage surgery involves making an incision over the abscess to drain the pus. Treatment may also involve antibiotic medicine, pain medicine, stool softeners, or laxatives. HOME CARE INSTRUCTIONS  Take medicines only as directed by your health care provider.  If you were prescribed an antibiotic, finish all of it even if you start to feel better.  To relieve pain, try sitting:  In a warm, shallow bath (sitz bath).  On a heating pad with the setting on low.  On an inflatable donut-shaped cushion.  Follow any diet instructions as directed by your health care  provider.  Keep all follow-up visits as directed by your health care provider. This is important. SEEK MEDICAL CARE IF:  Your abscess is bleeding.  You have pain, swelling, or redness that is getting worse.  You are constipated.  You feel ill.  You have muscle aches or chills.  You have a fever.  Your symptoms return after the abscess has healed.   This information is not intended to replace advice given to you by your health care provider. Make sure you discuss any questions you have with your health care provider.   Document Released: 02/13/2000 Document Revised: 11/06/2014 Document Reviewed: 12/26/2013 Elsevier Interactive Patient Education 2016 [employer]  CCS _______[facility]  RECTAL SURGERY POST OP INSTRUCTIONS: POST OP INSTRUCTIONS  Always review your discharge instruction sheet given to you by the facility where your surgery was performed. IF YOU HAVE DISABILITY OR FAMILY LEAVE FORMS, YOU MUST BRING THEM TO THE OFFICE FOR PROCESSING.   DO NOT GIVE THEM TO YOUR DOCTOR.  1. A  prescription for pain medication may be given to you upon discharge.  Take your pain medication as prescribed, if needed.  If narcotic pain medicine is not needed, then you may take acetaminophen (Tylenol) or ibuprofen (Advil) as needed. 2. Take your usually prescribed medications unless otherwise directed. 3. If you need a refill on your pain medication, please contact your pharmacy.  They will contact our office to request authorization. Prescriptions will not be filled after 5 pm or on week-ends. 4. You should follow a light diet the first 48 hours after arrival home, such  as soup and crackers, etc.  Be sure to include lots of fluids daily.  Resume your normal diet 2-3 days after surgery.. 5. Most patients will experience some swelling and discomfort in the rectal area. Ice packs, reclining and warm tub soaks will help.  Swelling and discomfort can take several days to  resolve.  6. It is common to experience some constipation if taking pain medication after surgery.  Increasing fluid intake and taking a stool softener (such as Colace) will usually help or prevent this problem from occurring.  A mild laxative (Milk of Magnesia or Miralax) should be taken according to package directions if there are no bowel movements after 48 hours. 7. Unless discharge instructions indicate otherwise, leave your bandage dry and in place for 24 hours, or remove the bandage if you have a bowel movement. You may notice a small amount of bleeding with bowel movements for the first few days. You may have some packing in the rectum which will come out over the first day or two. You will need to wear an absorbent pad or soft cotton gauze in your underwear until the drainage stops.it. 8. ACTIVITIES:  You may resume regular (light) daily activities beginning the next day--such as daily self-care, walking, climbing stairs--gradually increasing activities as tolerated.  You may have sexual intercourse when it is comfortable.  Refrain from any heavy lifting or straining until approved by your doctor. a. You may drive when you are no longer taking prescription pain medication, you can comfortably wear a seatbelt, and you can safely maneuver your car and apply brakes. b. RETURN TO WORK: : ____________________ c.  9. You should see your doctor in the office for a follow-up appointment approximately 2-3 weeks after your surgery.  Make sure that you call for this appointment within a day or two after you arrive home to insure a convenient appointment time. 10. OTHER INSTRUCTIONS:  __________________________________________________________________________________________________________________________________________________________________________________________  WHEN TO CALL YOUR DOCTOR: 1. Fever over 101.0 2. Inability to urinate 3. Nausea and/or vomiting 4. Extreme swelling or  bruising 5. Continued bleeding from rectum. 6. Increased pain, redness, or drainage from the incision 7. Constipation  The clinic staff is available to answer your questions during regular business hours.  Please dont hesitate to call and ask to speak to one of the nurses for clinical concerns.  If you have a medical emergency, go to the nearest emergency room or call 911.  A surgeon from [facility] is always on call at the hospital   [address], [address], [place], [place]  [address] ?  [address] [address], [place], [place]   [address] [phone] ? [phone] ? FAX [phone] Web site: [email or web address]  How to Take a Sitz Bath A sitz bath is a warm water bath that is taken while you are sitting down. The water should only come up to your hips and should cover your buttocks. Your health care provider may recommend a sitz bath to help you:   Clean the lower part of your body, including your genital area.  With itching.  With pain.  With sore muscles or muscles that tighten or spasm. HOW TO TAKE A SITZ BATH Take 3-4 sitz baths per day or as told by your health care provider. 6. Partially fill a bathtub with warm water. You will only need the water to be deep enough to cover your hips and buttocks when you are sitting in it. 7. If your health care provider told you to  put medicine in the water, follow the directions exactly. 8. Sit in the water and open the tub drain a little. 9. Turn on the warm water again to keep the tub at the correct level. Keep the water running constantly. 10. Soak in the water for 15-20 minutes or as told by your health care provider. 11. After the sitz bath, pat the affected area dry first. Do not rub it. 12. Be careful when you stand up after the sitz bath because you may feel dizzy. SEEK MEDICAL CARE IF:  Your symptoms get worse. Do not continue with sitz baths if your symptoms get worse.  You have new symptoms. Do not  continue with sitz baths until you talk with your health care provider.   This information is not intended to replace advice given to you by your health care provider. Make sure you discuss any questions you have with your health care provider.   Document Released: 11/08/2003 Document Revised: 07/02/2014 Document Reviewed: 02/13/2014 Elsevier Interactive Patient Education [employer]

## 2015-05-31 NOTE — Discharge Planning (Addendum)
AVS and dressing supplies to patient who verbalizes understanding. Able to demo wound care. Will dc home via private car aftter am dose of antibiotic complete. Dc amb to private car home at 1125.

## 2015-06-01 NOTE — Discharge Summary (Signed)
Physician Discharge Summary  Patient ID: [name] MaserJose Jesus Janae SauceCervantes Gomez MRN: [id number] DOB/AGE: Nov 25, 1975 40 y.o.  Admit date: 05/30/2015 Discharge date: 05/31/2015  Admission Diagnoses:  Recurrent perirectal abscess   Discharge Diagnoses:  Same  Active Problems:   Perirectal abscess   PROCEDURES:   Hospital Course:  Chief Complaint: Rectal pain since last Friday,05/23/15 HPI: pt has hx of perirectal abscess back in 09/17/2012 with I&D by Dr. Janee Mornhompson.  Work up in the ED shows he is afebrile, VSS. BMP OK, WBC is 12.4. CT scan shows: Recurrent perirectal/perianal abscess, U-shaped and eccentric to the left similar to the 2014 lesion. The largest pocket of fluid used 20 x 28 x 36 mm. Surrounding cellulitis. No pelvic involvement other than trace presacral stranding. Negative CT of the abdominal viscera.  He has taken some steroids from prior issue left over along with some old percocet for pain, before admission. He was seen in the ED and taken to the OR later that evening for I&D of the perirectal abscess.  He had a large space, but not that much purulent drainage.  Dr. Luisa Hartornett placed a penrose drain in the site and left it in place with plans to remove in the office in a couple weeks.   He tolerated the procedure well and was ready for discharge the following day.  He will follow up in the office.   Condition on d/c:  Improved     Disposition: 01-Home or Self Care     Medication List    STOP taking these medications        ketoconazole 2 % cream  Commonly known as:  NIZORAL     predniSONE 10 MG tablet  Commonly known as:  DELTASONE      TAKE these medications        acetaminophen 500 MG tablet  Commonly known as:  TYLENOL  Take 1,000 mg by mouth every 6 (six) hours as needed.     amoxicillin-clavulanate 875-125 MG tablet  Commonly known as:  AUGMENTIN  Take 1 tablet by mouth every 12 (twelve) hours.     ibuprofen 200 MG tablet  Commonly known as:   ADVIL,MOTRIN  Take 200 mg by mouth every 6 (six) hours as needed.     oxyCODONE-acetaminophen 5-325 MG tablet  Commonly known as:  ROXICET  Take 1-2 tablets by mouth every 4 (four) hours as needed for moderate pain.     saccharomyces boulardii 250 MG capsule  Commonly known as:  FLORASTOR  You can buy this at any drug store.  Follow package instructions and use for a week after you finish antibiotic.           Follow-up Information    Follow up with Harriette BouillonORNETT,THOMAS A., MD.   Specialty:  General Surgery   Why:  If our office does not call you with follow up by 06/03/15 call and set up appointment   Contact information:   [address] [place] [address] [phone]       Signed: Sherrie GeorgeJENNINGS,WILLARD 06/01/2015, 11:03 AM  Agree with above.  Ovidio Kinavid Eara Burruel, MD, [facility] Pager: [phone] Office phone:  [phone]

## 2015-06-01 NOTE — Anesthesia Postprocedure Evaluation (Signed)
Anesthesia Post Note  Patient: [name] [name]  Procedure(s) Performed: Procedure(s) (LRB): IRRIGATION AND DEBRIDEMENT PERIRECTAL ABSCESS (N/A)  Patient location during evaluation: PACU Anesthesia Type: General Level of consciousness: awake Vital Signs Assessment: post-procedure vital signs reviewed and stable Respiratory status: spontaneous breathing Cardiovascular status: blood pressure returned to baseline Anesthetic complications: no    Last Vitals:  Filed Vitals:   05/31/15 0200 05/31/15 0640  BP: 109/63 104/65  Pulse: 70 66  Temp: 36.9 C 37 C  Resp: 18 18    Last Pain:  Filed Vitals:   05/31/15 0904  PainSc: 2                  EDWARDS,Ildefonso Keaney

## 2015-06-02 ENCOUNTER — Encounter (HOSPITAL_COMMUNITY): Payer: Self-pay | Admitting: Surgery

## 2016-05-10 ENCOUNTER — Ambulatory Visit (INDEPENDENT_AMBULATORY_CARE_PROVIDER_SITE_OTHER): Payer: Self-pay | Admitting: Emergency Medicine

## 2016-05-10 VITALS — BP 144/92 | HR 90 | Temp 98.9°F | Resp 18 | Ht 65.0 in | Wt 176.4 lb

## 2016-05-10 DIAGNOSIS — R21 Rash and other nonspecific skin eruption: Secondary | ICD-10-CM

## 2016-05-10 DIAGNOSIS — N481 Balanitis: Secondary | ICD-10-CM

## 2016-05-10 MED ORDER — CLOTRIMAZOLE-BETAMETHASONE 1-0.05 % EX CREA: 1.0000 "application " | TOPICAL_CREAM | Freq: Two times a day (BID) | CUTANEOUS | 0 refills | Status: DC

## 2016-05-10 MED ORDER — CLOTRIMAZOLE-BETAMETHASONE 1-0.05 % EX CREA: 1.0000 "application " | TOPICAL_CREAM | Freq: Two times a day (BID) | CUTANEOUS | 2 refills | Status: DC

## 2016-05-10 MED ORDER — FLUCONAZOLE 150 MG PO TABS: 150.0000 mg | ORAL_TABLET | Freq: Once | ORAL | 2 refills | Status: AC

## 2016-05-10 MED ORDER — FLUCONAZOLE 150 MG PO TABS: 150.0000 mg | ORAL_TABLET | Freq: Once | ORAL | 0 refills | Status: DC

## 2016-05-10 NOTE — Patient Instructions (Addendum)
     IF you received an x-ray today, you will receive an invoice from [facility]. Please contact  Radiology at [phone] with questions or concerns regarding your invoice.   IF you received labwork today, you will receive an invoice from [facility]. Please contact [facility] at [phone] with questions or concerns regarding your invoice.   Our billing staff will not be able to assist you with questions regarding bills from these companies.  You will be contacted with the lab results as soon as they are available. The fastest way to get your results is to activate your My Chart account. Instructions are located on the last page of this paperwork. If you have not heard from us regarding the results in 2 weeks, please contact this office.    Balanitis (Balanitis) La balanitis es la inflamacin de la cabeza del pene (glande). CAUSAS Puede tener mltiples causas, tanto infecciosas como no infecciosas. Con frecuencia, la balanitis es el resultado de una higiene personal deficiente, especialmente en los hombres no circuncidados. Sin una higiene adecuada, los virus, las bacterias y los hongos se acumulan entre el prepucio y el glande. Esto puede ocasionar una infeccin. La falta de aire y la irritacin debido a la secrecin normal llamada esmegma contribuyen al problema en los hombres no circuncidados. Otras causas son:  Irritacin qumica por el uso de algunos jabones y geles de ducha (especialmente jabones con perfume), condones, lubricantes personales, vaselina, espermicidas y acondicionadores de telas.  Enfermedades de la piel, como el eczema, la dermatitis y la psoriasis.  Alergias a algunos frmacos, como tetraciclina y sulfas.  Algunas enfermedades como la cirrosis heptica, insuficiencia cardaca congestiva y enfermedades renales.  Obesidad mrbida. FACTORES DE RIESGO  Diabetes mellitus.  Un prepucio apretado que es difcil de tirar hacia atrs hasta pasar el  glande (fimosis).  Tener relaciones sexuales sin usar un condn.  SIGNOS Y SNTOMAS Los sntomas pueden ser:  Secrecin que proviene de la zona debajo del prepucio.  Sensibilidad.  Picazn e imposibilidad para mantener una ereccin (debido al dolor).  Irritacin y erupcin cutnea.  Llagas en el glande y el prepucio. DIAGNSTICO El diagnstico de balanitis se realiza a travs de un examen fsico. TRATAMIENTO El tratamiento se basa en la causa. El tratamiento puede incluir:  Lavados frecuentes.  Mantener el glande y el prepucio secos.  El uso de medicamentos, como cremas, analgsicos, antibiticos o medicamentos para tratar infecciones por hongos.  Baos de asiento. Si la irritacin est originada en una cicatriz del prepucio que impide una retraccin fcil, se recomienda una circuncisin. INSTRUCCIONES PARA EL CUIDADO EN EL HOGAR  Debe evitar las relaciones sexuales hasta que la afeccin se haya mejorado.  ASEGRESE DE QUE:  Comprende estas instrucciones.  Controlar su afeccin.  Recibir ayuda de inmediato si no mejora o si empeora.  Esta informacin no tiene como fin reemplazar el consejo del mdico. Asegrese de hacerle al mdico cualquier pregunta que tenga. Document Released: 10/18/2012 Document Revised: 02/20/2013 Document Reviewed: 08/07/2012 Elsevier Interactive Patient Education  2017 Elsevier Inc.  

## 2016-05-10 NOTE — Progress Notes (Signed)
[address][name] [name] 41 y.o.   Chief Complaint  Patient presents with  . other    red spot on private area x about a month     HISTORY OF PRESENT ILLNESS: This is a 41 y.o. male complaining of red spot to head of penis x 1 month; had it before, sexually active with condoms. No other significant symptoms.  HPI   Prior to Admission medications   Medication Sig Start Date End Date Taking? Authorizing Provider  acetaminophen (TYLENOL) 500 MG tablet Take 1,000 mg by mouth every 6 (six) hours as needed.    Historical Provider, MD  amoxicillin-clavulanate (AUGMENTIN) 875-125 MG tablet Take 1 tablet by mouth every 12 (twelve) hours. Patient not taking: Reported on 05/10/2016 05/31/15   Sherrie GeorgeWillard Jennings, PA-C  ibuprofen (ADVIL,MOTRIN) 200 MG tablet Take 200 mg by mouth every 6 (six) hours as needed.    Historical Provider, MD  oxyCODONE-acetaminophen (ROXICET) 5-325 MG tablet Take 1-2 tablets by mouth every 4 (four) hours as needed for moderate pain. Patient not taking: Reported on 05/10/2016 05/31/15   Sherrie GeorgeWillard Jennings, PA-C  saccharomyces boulardii (FLORASTOR) 250 MG capsule You can buy this at any drug store.  Follow package instructions and use for a week after you finish antibiotic. Patient not taking: Reported on 05/10/2016 05/31/15   Sherrie GeorgeWillard Jennings, PA-C    No Known Allergies  Patient Active Problem List   Diagnosis Date Noted  . Perirectal abscess 10/02/2012    Past Medical History:  Diagnosis Date  . 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 FLEXOR TENDON REPAIR Right 09/17/2014   Procedure: FLEXOR TENDON REPAIR right long finger ;  Surgeon: Mack Hookavid Thompson, MD;  Location: [facility];  Service: Orthopedics;  Laterality: Right;  . INCISION AND DRAINAGE PERIRECTAL ABSCESS N/A 09/17/2012   Procedure: IRRIGATION AND DEBRIDEMENT PERIRECTAL ABSCESS;  Surgeon: Liz MaladyBurke E Thompson, MD;  Location: [facility];  Service: General;  Laterality: N/A;  . INCISION AND DRAINAGE PERIRECTAL ABSCESS N/A 05/30/2015   Procedure: IRRIGATION AND DEBRIDEMENT PERIRECTAL ABSCESS;  Surgeon: Harriette Bouillonhomas Cornett, MD;  [facility];  Service: General;  Laterality: N/A;  . TENOLYSIS Right 03/11/2015   Procedure: RIGHT LONG FINGER Z-PLASTY, JOINT RELEASE, DIGITAL NEUROPLASTY AND FLEXOR TENOLYSIS;  Surgeon: Mack Hookavid Thompson, MD;  Location: [facility];  Service: Orthopedics;  Laterality: Right;  ANESTHESIA WITH PRE-OP BLOCK    Social History   Social History  . Marital status: Legally Separated    Spouse name: N/A  . Number of children: N/A  . Years of education: N/A   Occupational History  . Not on file.   Social History Main Topics  . Smoking status: Former Smoker    Quit date: 02/28/2009  . Smokeless tobacco: Never Used  . Alcohol use Yes     Comment: occasionally  . Drug use: No  . Sexual activity: Not on file   Other Topics Concern  . Not on file   Social History Narrative  . No narrative on file    No family history on file.   Review of Systems  Constitutional: Negative for chills and fever.  HENT: Negative for sore throat.   Eyes: Negative for discharge and redness.  Respiratory: Negative for shortness of breath.   Gastrointestinal: Negative for abdominal pain, nausea and vomiting.  Genitourinary: Negative for dysuria and hematuria.  Musculoskeletal: Negative for myalgias.  Skin: Positive for rash (penile rash).  All other systems reviewed and are negative.  Vitals:   05/10/16 1107  BP: (!) 144/92  Pulse: 90  Resp: 18  Temp: 98.9 F (37.2 C)    Physical Exam  Constitutional: He is oriented to person, place, and time. He appears well-developed and well-nourished.  HENT:  Mouth/Throat: Oropharynx is clear and moist.  Eyes: EOM are normal. Pupils are equal, round, and  reactive to light.  Neck: Normal range of motion. Neck supple.  Cardiovascular: Normal rate.   Pulmonary/Chest: Effort normal.  Abdominal: Soft. He exhibits no distension. There is no tenderness.  Genitourinary:  Genitourinary Comments: +erythematous well demarcated area to base of glans; uncircumcised; no ulcers or chancres; no adenopathy and no discharge.  Musculoskeletal: Normal range of motion.  Neurological: He is alert and oriented to person, place, and time.  Skin: Skin is warm and dry.  Vitals reviewed.    ASSESSMENT & PLAN: Trevar was seen today for other.  Diagnoses and all orders for this visit:  Balanitis  Rash of penis  Other orders -     fluconazole (DIFLUCAN) 150 MG tablet; Take 1 tablet (150 mg total) by mouth once. -     clotrimazole-betamethasone (LOTRISONE) cream; Apply 1 application topically 2 (two) times daily.   Patient Instructions       IF you received an x-ray today, you will receive an invoice from [facility]. Please contact [facility] at [phone] with questions or concerns regarding your invoice.   IF you received labwork today, you will receive an invoice from [facility]. Please contact [facility] at [phone] with questions or concerns regarding your invoice.   Our billing staff will not be able to assist you with questions regarding bills from these companies.  You will be contacted with the lab results as soon as they are available. The fastest way to get your results is to activate your My Chart account. Instructions are located on the last page of this paperwork. If you have not heard from [place] regarding the results in 2 weeks, please contact this office.     Balanitis (Balanitis) La balanitis es la inflamacin de la cabeza del pene (glande). CAUSAS Puede tener mltiples causas, tanto infecciosas como no infecciosas. Con frecuencia, la balanitis es el resultado de una higiene personal deficiente, especialmente en los  hombres no circuncidados. Sin una higiene [place], los virus, las bacterias y los hongos se acumulan entre el prepucio y el glande. Esto puede ocasionar una infeccin. La falta de aire y la irritacin debido a la secrecin normal llamada esmegma contribuyen al problema en los hombres no circuncidados. Otras causas son:  Irritacin qumica por el uso de algunos jabones y geles de ducha (especialmente jabones con perfume), condones, lubricantes personales, vaselina, espermicidas y acondicionadores de telas.  Enfermedades de la piel, como el eczema, la dermatitis y la psoriasis.  Alergias a algunos frmacos, como tetraciclina y sulfas.  Algunas enfermedades como la cirrosis heptica, insuficiencia cardaca congestiva y enfermedades renales.  Obesidad mrbida. FACTORES DE RIESGO  Diabetes mellitus.  Un prepucio apretado que es difcil de tirar hacia atrs hasta pasar el glande (fimosis).  Tener relaciones sexuales sin usar un condn. Blake Divine SNTOMAS Los sntomas pueden ser:  Secrecin que proviene de la zona debajo del prepucio.  Sensibilidad.  Picazn e imposibilidad para Landscape architect ereccin (debido al dolor).  Irritacin y erupcin cutnea.  Llagas en el  glande y el prepucio. DIAGNSTICO El diagnstico de balanitis se realiza a travs de un examen fsico. TRATAMIENTO El tratamiento se basa en la causa. El tratamiento puede incluir:  Lavados frecuentes.  Mantener el glande y el prepucio secos.  El uso de medicamentos, como cremas, Clinical research associate, antibiticos o medicamentos para tratar infecciones por hongos.  Baos de asiento. Si la irritacin est originada en una cicatriz del prepucio que impide una retraccin fcil, se recomienda una circuncisin. INSTRUCCIONES PARA EL CUIDADO EN EL HOGAR  Debe evitar las relaciones sexuales hasta que la afeccin se haya mejorado. ASEGRESE DE QUE:  Comprende estas instrucciones.  Controlar su afeccin.  Recibir ayuda de  inmediato si no mejora o si empeora. Esta informacin no tiene Theme park manager el consejo del mdico. Asegrese de hacerle al mdico cualquier pregunta que tenga. Document Released: 10/18/2012 Document Revised: 02/20/2013 Document Reviewed: 08/07/2012 Elsevier Interactive Patient Education  2017 Elsevier Inc.      Edwina Barth, MD [facility]

## 2016-05-10 NOTE — Addendum Note (Signed)
Addended by: Evie LacksSAGARDIA, Demeco Ducksworth J on: 05/10/2016 11:32 AM   Modules accepted: Orders

## 2018-01-07 IMAGING — CT CT ABD-PELV W/ CM
2 of 4 series · 16 of 46 positions shown, 18 images · IV contrast (iopamidol)
Comparison: Chest CT 10/15/2014.  CT Abdomen and Pelvis 09/17/2012.

CLINICAL DATA: 40-year-old male with rectal pain for 1 week.
Personal history of perirectal abscess in 0252. Initial encounter.

EXAM:
CT ABDOMEN AND PELVIS WITH CONTRAST
TECHNIQUE: Multidetector CT imaging of the abdomen and pelvis was performed
using the standard protocol following bolus administration of
intravenous contrast.
CONTRAST:  100mL [id number] IOPAMIDOL ([id number]) INJECTION 61%

[Series 2: a/p w/ 5mm · axial · 0.94mm/px · z∈[-513,-13]mm · 13 of 110 slices shown, 15 images]
[im 5/110  soft-tissue]
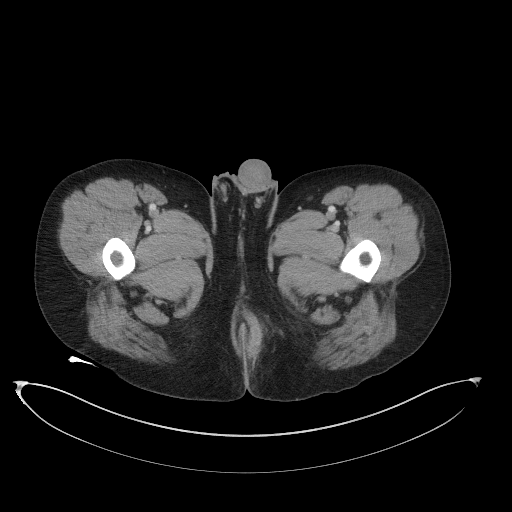
[im 5/110  bone]
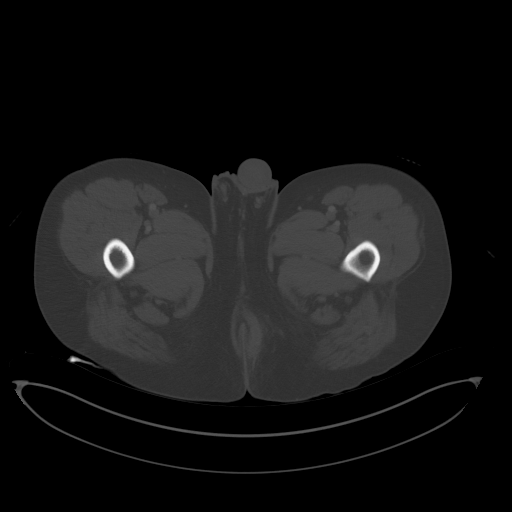
[im 15/110  soft-tissue]
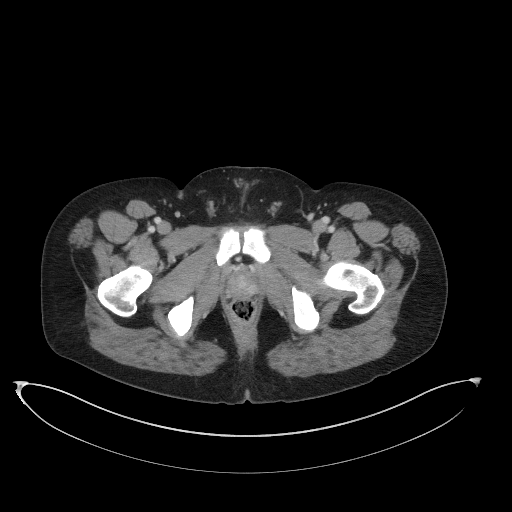
[im 24/110  soft-tissue]
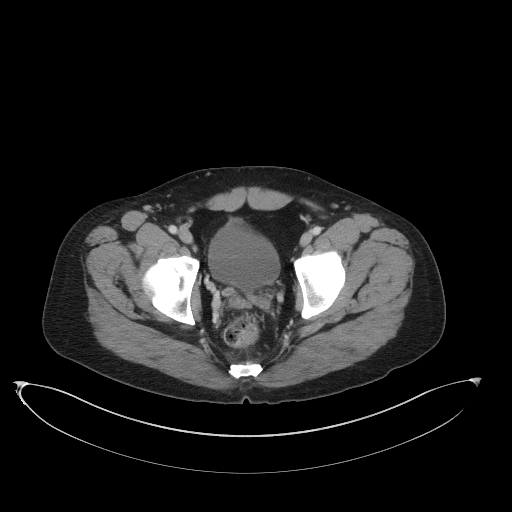
[im 29/110  soft-tissue]
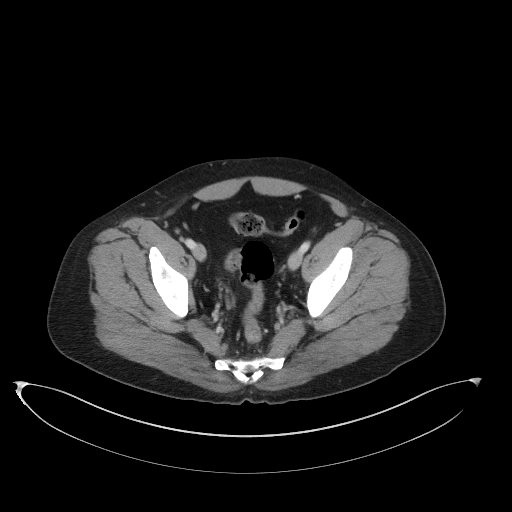
[im 38/110  soft-tissue]
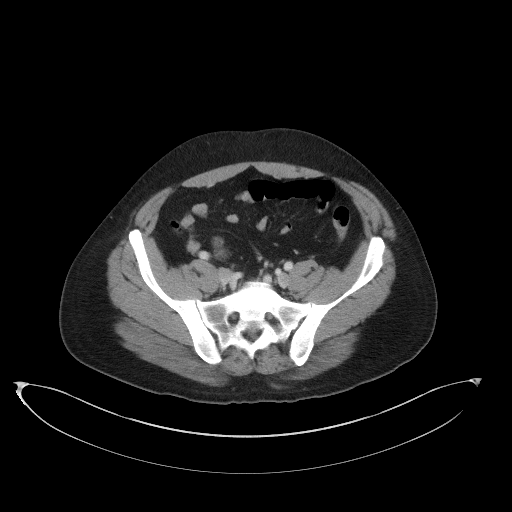
[im 48/110  soft-tissue]
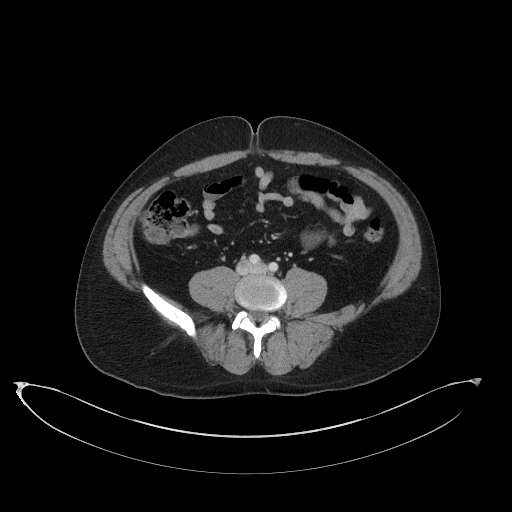
[im 57/110  soft-tissue]
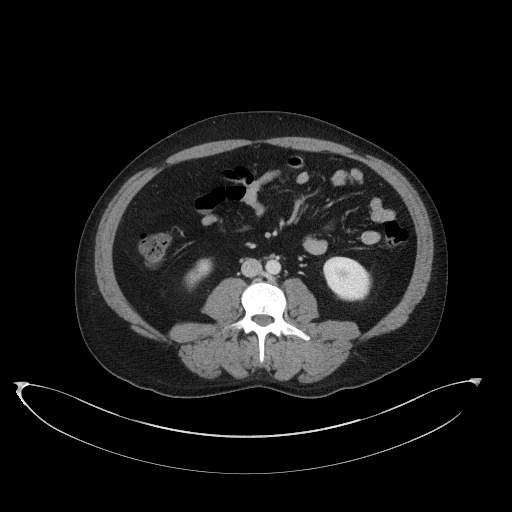
[im 62/110  soft-tissue]
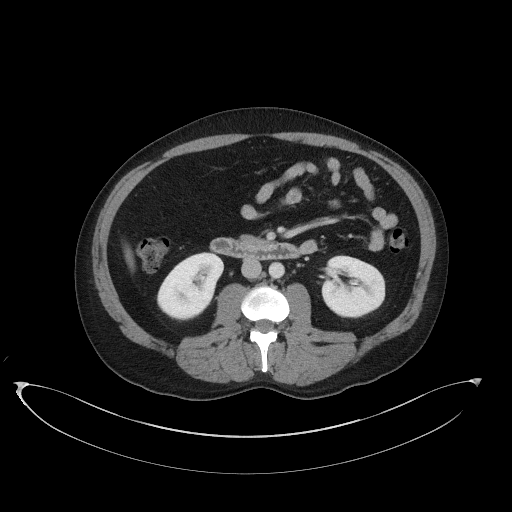
[im 72/110  soft-tissue]
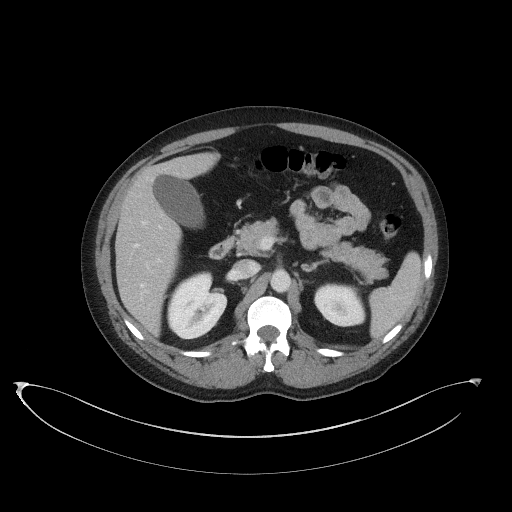
[im 72/110  bone]
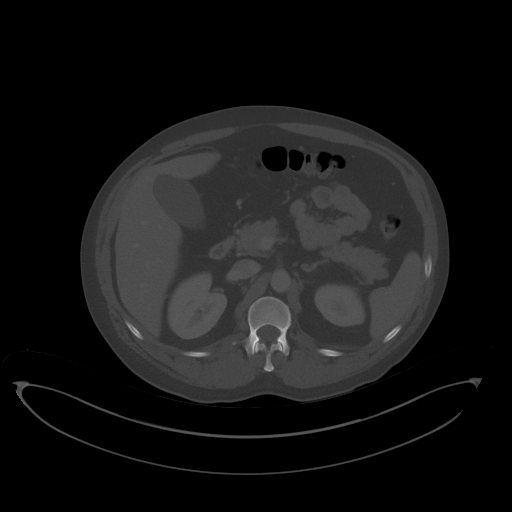
[im 81/110  soft-tissue]
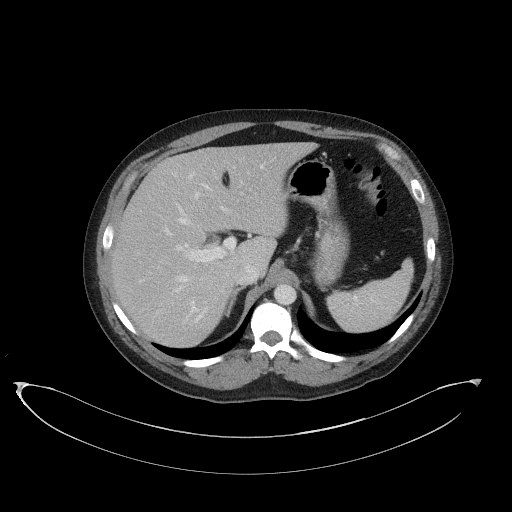
[im 86/110  soft-tissue]
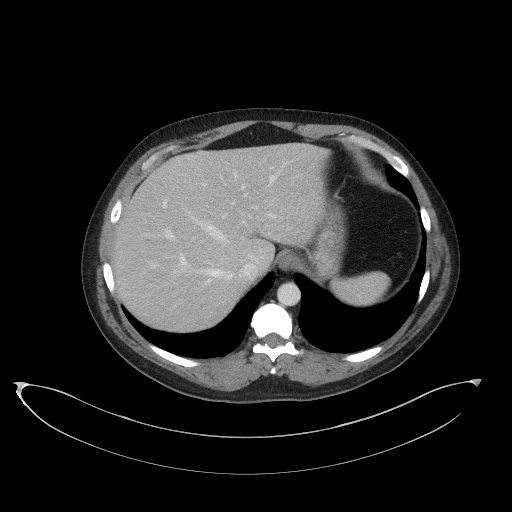
[im 95/110  soft-tissue]
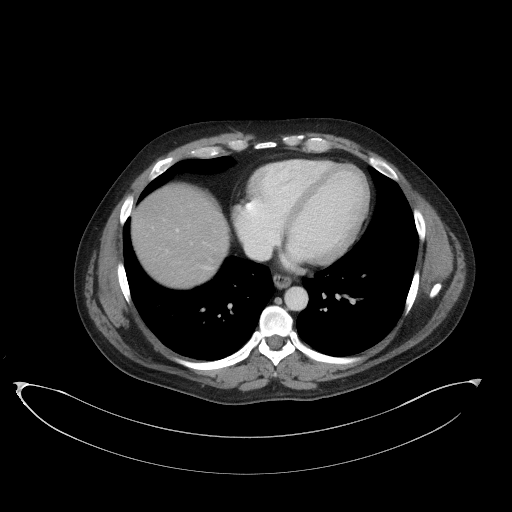
[im 105/110  soft-tissue]
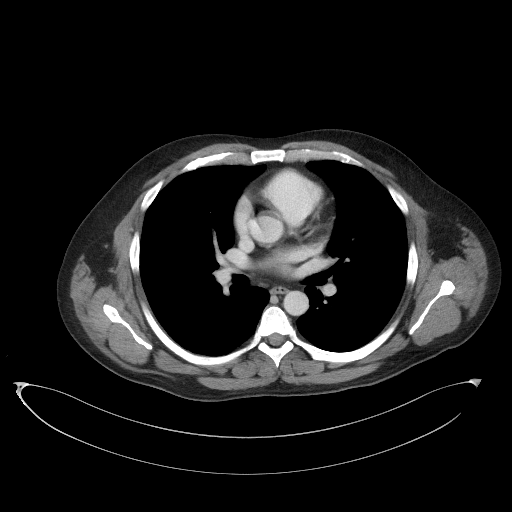

[Series 5: a/p w/ cor · coronal · 0.80mm/px · 3 of 150 slices shown]
[im 50/150  soft-tissue]
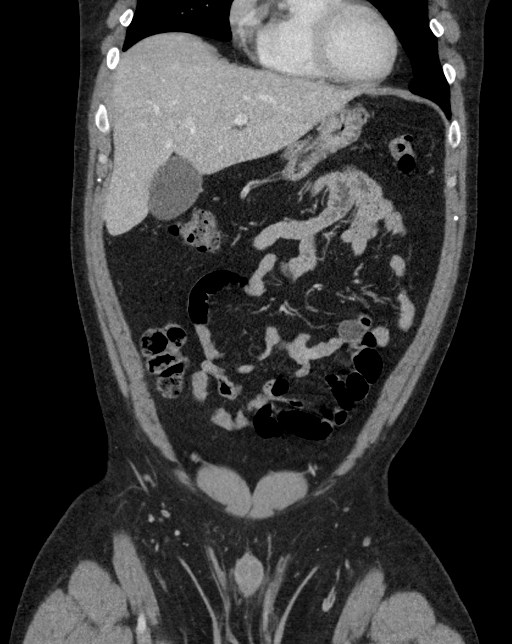
[im 67/150  soft-tissue]
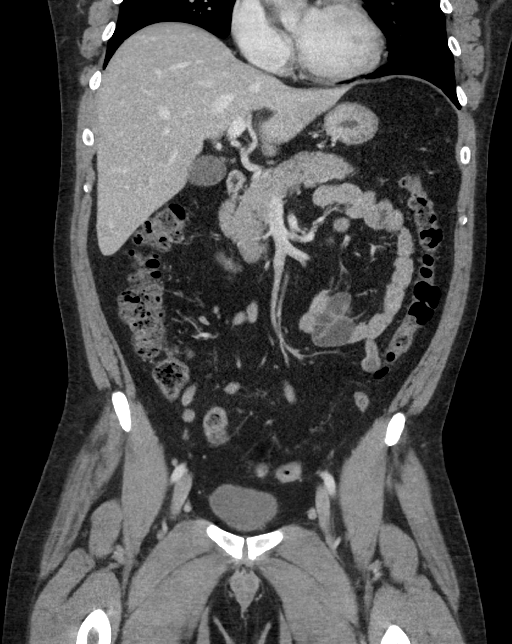
[im 83/150  soft-tissue]
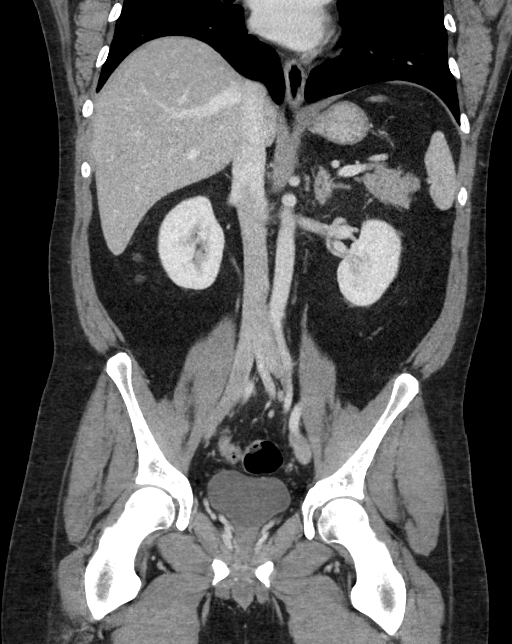

[16 of 46 positions shown; findings below may reference images not displayed]

FINDINGS: Negative lung bases other than mild dependent atelectasis on the
right. No pericardial or pleural effusion.

No acute osseous abnormality identified.

Recurrent perirectal/perianal abscess eccentric to the left, but
U-shaped overall with a smaller component to the right of midline
and of moderate size component in the posterior midline. In all, the
most confluent component of fluid encompasses 20 x 28 x 36 mm (AP by
transverse by CC). See series 2, images 100-104 and coronal image
117. The configuration is very similar to the 0252 abscess. There is
similar associated regional soft tissue thickening and stranding.

Mild presacral stranding but no pelvic free fluid. No definite
rectal wall thickening within the pelvis. Diminutive urinary
bladder.

Negative sigmoid colon, left colon, transverse colon, right colon,
appendix, terminal ileum, small bowel loops, stomach and duodenum.

No abdominal free air or free fluid. Liver, gallbladder, spleen,
pancreas, and adrenal glands are within normal limits. Portal venous
system is patent. Major arterial structures appear normal.

No lymphadenopathy in the abdomen or pelvis. There is probably early
renal medullary contrast excretion occurring on these images. Renal
enhancement otherwise within normal limits.
IMPRESSION: 1. Recurrent perirectal/perianal abscess, U-shaped and eccentric to
the left similar to the 0252 lesion. The largest pocket of fluid
used 20 x 28 x 36 mm. Surrounding cellulitis.
2. No pelvic involvement other than trace presacral stranding.
3. Negative CT of the abdominal viscera.

## 2018-01-16 ENCOUNTER — Other Ambulatory Visit: Payer: Self-pay | Admitting: Emergency Medicine

## 2018-01-16 NOTE — Telephone Encounter (Signed)
Pt is wanting a refill on Betamethasone Dipronate Cream, does patient need appt or can Dr.Sgardia call in a refill Please call pt [phone]

## 2018-01-17 NOTE — Telephone Encounter (Signed)
Please advise patient he will need an appointment for refills

## 2018-01-17 NOTE — Telephone Encounter (Signed)
Please advise patient that he needs an appointment for refills.

## 2018-02-03 ENCOUNTER — Other Ambulatory Visit: Payer: Self-pay

## 2018-02-03 ENCOUNTER — Ambulatory Visit: Payer: Self-pay | Admitting: Emergency Medicine

## 2018-02-03 ENCOUNTER — Encounter: Payer: Self-pay | Admitting: Emergency Medicine

## 2018-02-03 VITALS — BP 126/75 | HR 77 | Temp 98.7°F | Resp 16 | Ht 64.0 in | Wt 178.8 lb

## 2018-02-03 DIAGNOSIS — L309 Dermatitis, unspecified: Secondary | ICD-10-CM

## 2018-02-03 MED ORDER — CLOTRIMAZOLE-BETAMETHASONE 1-0.05 % EX CREA: 1.0000 "application " | TOPICAL_CREAM | Freq: Two times a day (BID) | CUTANEOUS | 2 refills | Status: AC

## 2018-02-03 MED ORDER — FLUCONAZOLE 150 MG PO TABS: 150.0000 mg | ORAL_TABLET | Freq: Once | ORAL | 0 refills | Status: AC

## 2018-02-03 NOTE — Patient Instructions (Addendum)
If you have lab work done today you will be contacted with your lab results within the next 2 weeks.  If you have not heard from [place] then please contact us. The fastest way to get your results is to register for My Chart.   IF you received an x-ray today, you will receive an invoice from [facility]. Please contact [facility] at [phone] with questions or concerns regarding your invoice.   IF you received labwork today, you will receive an invoice from [place]. Please contact [facility] at [phone] with questions or concerns regarding your invoice.   Our billing staff will not be able to assist you with questions regarding bills from these companies.  You will be contacted with the lab results as soon as they are available. The fastest way to get your results is to activate your My Chart account. Instructions are located on the last page of this paperwork. If you have not heard from [place] regarding the results in 2 weeks, please contact this office.     Eczema Eczema is a broad term for a group of skin conditions that cause skin to become rough and inflamed. Each type of eczema has different triggers, symptoms, and treatments. Eczema of any type is usually itchy and symptoms range from mild to severe. Eczema and its symptoms are not spread from person to person (are not contagious). It can appear on different parts of the body at different times. Your eczema may not look the same as someone else's eczema. What are the types of eczema? Atopic dermatitis This is a long-term (chronic) skin disease that keeps coming back (recurring). Usual symptoms are dry skin and small, solid pimples that may swell and leak fluid (weep). Contact dermatitis This happens when something irritates the skin and causes a rash. The irritation can come from substances that you are allergic to (allergens), such as poison ivy, chemicals, or medicines that were applied to your skin. Dyshidrotic  eczema This is a form of eczema on the hands and feet. It shows up as very itchy, fluid-filled blisters. It can affect people of any age, but is more common before age 8. Hand eczema This causes very itchy areas of skin on the palms and sides of the hands and fingers. This type of eczema is common in industrial jobs where you may be exposed to many different types of irritants. Lichen simplex chronicus This type of eczema occurs when a person constantly scratches one area of the body. Repeated scratching of the area leads to thickened skin (lichenification). Lichen simplex chronicus can occur along with other types of eczema. It is more common in adults, but may be seen in children as well. Nummular eczema This is a common type of eczema. It has no known cause. It typically causes a red, circular, crusty lesion (plaque) that may be itchy. Scratching may become a habit and can cause bleeding. Nummular eczema occurs most often in people of middle-age or older. It most often affects the hands. Seborrheic dermatitis This is a common skin disease that mainly affects the scalp. It may also affect any oily areas of the body, such as the face, sides of nose, eyebrows, ears, eyelids, and chest. It is marked by small scaling and redness of the skin (erythema). This can affect people of all ages. In infants, this condition is known as Chartered certified accountant." Stasis dermatitis This is a common skin disease that usually appears on the legs and feet. It most often  occurs in people who have a condition that prevents blood from being pumped through the veins in the legs (chronic venous insufficiency). Stasis dermatitis is a chronic condition that needs long-term management. How is eczema diagnosed? Your health care provider will examine your skin and review your medical history. He or she may also give you skin patch tests. These tests involve taking patches that contain possible allergens and placing them on your back. He or  she will then check in a few days to see if an allergic reaction occurred. What are the common treatments? Treatment for eczema is based on the type of eczema you have. Hydrocortisone steroid medicine can relieve itching quickly and help reduce inflammation. This medicine may be prescribed or obtained over-the-counter, depending on the strength of the medicine that is needed. Follow these instructions at home:  Take over-the-counter and prescription medicines only as told by your health care provider.  Use creams or ointments to moisturize your skin. Do not use lotions.  Learn what triggers or irritates your symptoms. Avoid these things.  Treat symptom flare-ups quickly.  Do not itch your skin. This can make your rash worse.  Keep all follow-up visits as told by your health care provider. This is important. Where to find more information:  The American Academy of Dermatology: www.aad.org  The National Eczema Association: www.nationaleczema.org Contact a health care provider if:  You have serious itching, even with treatment.  You regularly scratch your skin until it bleeds.  Your rash looks different than usual.  Your skin is painful, swollen, or more red than usual.  You have a fever. Summary  There are eight general types of eczema. Each type has different triggers.  Eczema of any type causes itching that may range from mild to severe.  Treatment varies based on the type of eczema you have. Hydrocortisone steroid medicine can help with itching and inflammation.  Protecting your skin is the best way to prevent eczema. Use moisturizers and lotions. Avoid triggers and irritants, and treat flare-ups quickly. This information is not intended to replace advice given to you by your health care provider. Make sure you discuss any questions you have with your health care provider. Document Released: 07/01/2016 Document Revised: 07/01/2016 Document Reviewed: 07/01/2016 Elsevier  Interactive Patient Education  2018 Elsevier Inc.  

## 2018-02-03 NOTE — Progress Notes (Signed)
[name] [name] 42 y.o.   Chief Complaint  Patient presents with  . Medication Refill    Clotrimazole-Betamethasone     HISTORY OF PRESENT ILLNESS: This is a 42 y.o. male complaining of inflammation to perianal area on and off for the past several months.  No other significant symptoms.  HPI   Prior to Admission medications   Medication Sig Start Date End Date Taking? Authorizing Provider  acetaminophen (TYLENOL) 500 MG tablet Take 1,000 mg by mouth every 6 (six) hours as needed.    [provider]  clotrimazole-betamethasone (LOTRISONE) cream Apply 1 application topically 2 (two) times daily. Patient not taking: Reported on 02/03/2018 05/10/16   Georgina QuintSagardia, Maley Venezia Ishaq, MD  ibuprofen (ADVIL,MOTRIN) 200 MG tablet Take 200 mg by mouth every 6 (six) hours as needed.    [provider]  oxyCODONE-acetaminophen (ROXICET) 5-325 MG tablet Take 1-2 tablets by mouth every 4 (four) hours as needed for moderate pain. Patient not taking: Reported on 05/10/2016 05/31/15   Sherrie GeorgeJennings, Willard, PA-C  saccharomyces boulardii (FLORASTOR) 250 MG capsule You can buy this at any drug store.  Follow package instructions and use for a week after you finish antibiotic. Patient not taking: Reported on 05/10/2016 05/31/15   Sherrie GeorgeJennings, Willard, PA-C    No Known Allergies  Patient Active Problem List   Diagnosis Date Noted  . Balanitis 05/10/2016  . Perirectal abscess 10/02/2012    Past Medical History:  Diagnosis Date  . 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 FLEXOR TENDON REPAIR Right 09/17/2014   Procedure: FLEXOR TENDON REPAIR right long finger ;  Surgeon: Mack Hookavid Thompson, MD;  Location: [facility];  Service: Orthopedics;  Laterality: Right;  . INCISION AND DRAINAGE PERIRECTAL ABSCESS N/A  09/17/2012   Procedure: IRRIGATION AND DEBRIDEMENT PERIRECTAL ABSCESS;  Surgeon: Liz MaladyBurke E Thompson, MD;  Location: [facility] OR;  Service: General;  Laterality: N/A;  . INCISION AND DRAINAGE PERIRECTAL ABSCESS N/A 05/30/2015   Procedure: IRRIGATION AND DEBRIDEMENT PERIRECTAL ABSCESS;  Surgeon: Harriette Bouillonhomas Cornett, MD;  [facility];  Service: General;  Laterality: N/A;  . TENOLYSIS Right 03/11/2015   Procedure: RIGHT LONG FINGER Z-PLASTY, JOINT RELEASE, DIGITAL NEUROPLASTY AND FLEXOR TENOLYSIS;  Surgeon: Mack Hookavid Thompson, MD;  Location: [facility];  Service: Orthopedics;  Laterality: Right;  ANESTHESIA WITH PRE-OP BLOCK    Social History   Socioeconomic History  . Marital status: Legally Separated    Spouse name: Not on file  . Number of children: Not on file  . Years of education: Not on file  . Highest education level: Not on file  Occupational History  . Not on file  Social Needs  . Financial resource strain: Not on file  . Food insecurity:    Worry: Not on file    Inability: Not on file  . Transportation needs:    Medical: Not on file    Non-medical: Not on file  Tobacco Use  . Smoking status: Former Smoker    Last attempt to quit: 02/28/2009    Years since quitting: 8.9  . Smokeless tobacco: Never Used  Substance and Sexual Activity  . Alcohol use: Yes    Comment: occasionally  . Drug use: No  . Sexual activity: Not on file  Lifestyle  . Physical activity:  Days per week: Not on file    Minutes per session: Not on file  . Stress: Not on file  Relationships  . Social connections:    Talks on phone: Not on file    Gets together: Not on file    Attends religious service: Not on file    Active member of club or organization: Not on file    Attends meetings of clubs or organizations: Not on file    Relationship status: Not on file  . Intimate partner violence:    Fear of current or ex partner: Not on file    Emotionally abused: Not on file    Physically abused:  Not on file    Forced sexual activity: Not on file  Other Topics Concern  . Not on file  Social History Narrative  . Not on file    No family history on file.   Review of Systems  Constitutional: Negative.  Negative for chills and fever.  HENT: Negative for sore throat.   Eyes: Negative.   Respiratory: Negative for cough and shortness of breath.   Cardiovascular: Negative for chest pain and palpitations.  Gastrointestinal: Negative for abdominal pain, diarrhea, nausea and vomiting.  Genitourinary: Negative.  Negative for dysuria and hematuria.  Musculoskeletal: Negative.  Negative for myalgias and neck pain.  Skin: Positive for itching and rash.  Neurological: Negative.  Negative for dizziness and headaches.  Endo/Heme/Allergies: Negative.   All other systems reviewed and are negative.    Vitals:   02/03/18 1523  BP: 126/75  Pulse: 77  Resp: 16  Temp: 98.7 F (37.1 C)  SpO2: 99%     Physical Exam  Constitutional: He is oriented to person, place, and time. He appears well-developed and well-nourished.  HENT:  Head: Normocephalic and atraumatic.  Eyes: Pupils are equal, round, and reactive to light. EOM are normal.  Neck: Normal range of motion. Neck supple.  Cardiovascular: Normal rate, regular rhythm and normal heart sounds.  Pulmonary/Chest: Effort normal and breath sounds normal.  Musculoskeletal: Normal range of motion.  Neurological: He is alert and oriented to person, place, and time.  Skin: Skin is warm and dry. Capillary refill takes less than 2 seconds.  Perianal area looks erythematous compatible with fungal infection/dermatitis.  Psychiatric: He has a normal mood and affect. His behavior is normal.  Vitals reviewed.    ASSESSMENT & PLAN: [name] was seen today for medication refill.  Diagnoses and all orders for this visit:  Dermatitis of perianal region -     clotrimazole-betamethasone (LOTRISONE) cream; Apply 1 application topically 2 (two) times  daily. -     fluconazole (DIFLUCAN) 150 MG tablet; Take 1 tablet (150 mg total) by mouth once for 1 dose.    Patient Instructions       If you have lab work done today you will be contacted with your lab results within the next 2 weeks.  If you have not heard from [place] then please contact us. The fastest way to get your results is to register for My Chart.   IF you received an x-ray today, you will receive an invoice from [facility]. Please contact [facility] at [phone] with questions or concerns regarding your invoice.   IF you received labwork today, you will receive an invoice from [place]. Please contact [facility] at [phone] with questions or concerns regarding your invoice.   Our billing staff will not be able to assist you with questions regarding bills from these companies.  You will be contacted with the lab results as soon as they are available. The fastest way to get your results is to activate your My Chart account. Instructions are located on the last page of this paperwork. If you have not heard from [place] regarding the results in 2 weeks, please contact this office.     Eczema Eczema is a broad term for a group of skin conditions that cause skin to become rough and inflamed. Each type of eczema has different triggers, symptoms, and treatments. Eczema of any type is usually itchy and symptoms range from mild to severe. Eczema and its symptoms are not spread from person to person (are not contagious). It can appear on different parts of the body at different times. Your eczema may not look the same as someone else's eczema. What are the types of eczema? Atopic dermatitis This is a long-term (chronic) skin disease that keeps coming back (recurring). Usual symptoms are dry skin and small, solid pimples that may swell and leak fluid (weep). Contact dermatitis This happens when something irritates the skin and causes a rash. The irritation can come from  substances that you are allergic to (allergens), such as poison ivy, chemicals, or medicines that were applied to your skin. Dyshidrotic eczema This is a form of eczema on the hands and feet. It shows up as very itchy, fluid-filled blisters. It can affect people of any age, but is more common before age 69. Hand eczema This causes very itchy areas of skin on the palms and sides of the hands and fingers. This type of eczema is common in industrial jobs where you may be exposed to many different types of irritants. Lichen simplex chronicus This type of eczema occurs when a person constantly scratches one area of the body. Repeated scratching of the area leads to thickened skin (lichenification). Lichen simplex chronicus can occur along with other types of eczema. It is more common in adults, but may be seen in children as well. Nummular eczema This is a common type of eczema. It has no known cause. It typically causes a red, circular, crusty lesion (plaque) that may be itchy. Scratching may become a habit and can cause bleeding. Nummular eczema occurs most often in people of middle-age or older. It most often affects the hands. Seborrheic dermatitis This is a common skin disease that mainly affects the scalp. It may also affect any oily areas of the body, such as the face, sides of nose, eyebrows, ears, eyelids, and chest. It is marked by small scaling and redness of the skin (erythema). This can affect people of all ages. In infants, this condition is known as Location manager." Stasis dermatitis This is a common skin disease that usually appears on the legs and feet. It most often occurs in people who have a condition that prevents blood from being pumped through the veins in the legs (chronic venous insufficiency). Stasis dermatitis is a chronic condition that needs long-term management. How is eczema diagnosed? Your health care provider will examine your skin and review your medical history. He or she may  also give you skin patch tests. These tests involve taking patches that contain possible allergens and placing them on your back. He or she will then check in a few days to see if an allergic reaction occurred. What are the common treatments? Treatment for eczema is based on the type of eczema you have. Hydrocortisone steroid medicine can relieve itching quickly and help reduce inflammation. This medicine may  be prescribed or obtained over-the-counter, depending on the strength of the medicine that is needed. Follow these instructions at home:  Take over-the-counter and prescription medicines only as told by your health care provider.  Use creams or ointments to moisturize your skin. Do not use lotions.  Learn what triggers or irritates your symptoms. Avoid these things.  Treat symptom flare-ups quickly.  Do not itch your skin. This can make your rash worse.  Keep all follow-up visits as told by your health care provider. This is important. Where to find more information:  The American Academy of Dermatology: InfoExam.si  The National Eczema Association: www.nationaleczema.org Contact a health care provider if:  You have serious itching, even with treatment.  You regularly scratch your skin until it bleeds.  Your rash looks different than usual.  Your skin is painful, swollen, or more red than usual.  You have a fever. Summary  There are eight general types of eczema. Each type has different triggers.  Eczema of any type causes itching that may range from mild to severe.  Treatment varies based on the type of eczema you have. Hydrocortisone steroid medicine can help with itching and inflammation.  Protecting your skin is the best way to prevent eczema. Use moisturizers and lotions. Avoid triggers and irritants, and treat flare-ups quickly. This information is not intended to replace advice given to you by your health care provider. Make sure you discuss any questions you have  with your health care provider. Document Released: 07/01/2016 Document Revised: 07/01/2016 Document Reviewed: 07/01/2016 Elsevier Interactive Patient Education  2018 Elsevier Inc.      Edwina Barth, MD [facility]

## 2019-10-19 ENCOUNTER — Ambulatory Visit: Payer: MEDICAID | Attending: Internal Medicine

## 2019-10-19 DIAGNOSIS — Z23 Encounter for immunization: Secondary | ICD-10-CM

## 2019-10-19 NOTE — Progress Notes (Signed)
   Covid-19 Vaccination Clinic  Name:  [name] [name]    MRN: [id number] DOB: [birth year]  10/19/2019  [name] [name] was observed post Covid-19 immunization for 15 minutes without incident. He was provided with Vaccine Information Sheet and instruction to access the V-Safe system.   [name] [name] was instructed to call 911 with any severe reactions post vaccine: Marland Kitchen Difficulty breathing  . Swelling of face and throat  . A fast heartbeat  . A bad rash all over body  . Dizziness and weakness   Immunizations Administered    Name Date Dose VIS Date Route   Pfizer COVID-19 Vaccine 10/19/2019 10:54 AM 0.3 mL 04/25/2018 Intramuscular   Manufacturer: [employer], [employer]   Lot: [id number]   NDC: [address]-2

## 2020-07-11 ENCOUNTER — Emergency Department (HOSPITAL_COMMUNITY)
Admission: EM | Admit: 2020-07-11 | Discharge: 2020-07-11 | Disposition: A | Discharge status: Home / Self Care | Payer: Self-pay | Attending: Emergency Medicine | Admitting: Emergency Medicine

## 2020-07-11 ENCOUNTER — Other Ambulatory Visit: Payer: Self-pay

## 2020-07-11 ENCOUNTER — Emergency Department (HOSPITAL_COMMUNITY): Payer: Self-pay

## 2020-07-11 ENCOUNTER — Encounter (HOSPITAL_COMMUNITY): Payer: Self-pay | Admitting: *Deleted

## 2020-07-11 DIAGNOSIS — R1011 Right upper quadrant pain: Secondary | ICD-10-CM | POA: Insufficient documentation

## 2020-07-11 DIAGNOSIS — R0781 Pleurodynia: Secondary | ICD-10-CM | POA: Insufficient documentation

## 2020-07-11 DIAGNOSIS — Z87891 Personal history of nicotine dependence: Secondary | ICD-10-CM | POA: Insufficient documentation

## 2020-07-11 LAB — COMPREHENSIVE METABOLIC PANEL
ALT: 42 U/L (ref 0–44)
AST: 24 U/L (ref 15–41)
Albumin: 4.3 g/dL (ref 3.5–5.0)
Alkaline Phosphatase: 50 U/L (ref 38–126)
Anion gap: 6 (ref 5–15)
BUN: 12 mg/dL (ref 6–20)
CO2: 26 mmol/L (ref 22–32)
Calcium: 9.2 mg/dL (ref 8.9–10.3)
Chloride: 105 mmol/L (ref 98–111)
Creatinine, Ser: 0.75 mg/dL (ref 0.61–1.24)
GFR, Estimated: >60 mL/min (ref >60)
Glucose, Bld: 107 mg/dL — ABNORMAL HIGH (ref 70–99)
Potassium: 3.7 mmol/L (ref 3.5–5.1)
Sodium: 137 mmol/L (ref 135–145)
Total Bilirubin: 0.7 mg/dL (ref 0.3–1.2)
Total Protein: 7.4 g/dL (ref 6.5–8.1)

## 2020-07-11 LAB — CBC WITH DIFFERENTIAL/PLATELET
Abs Immature Granulocytes: 0.02 10*3/uL (ref 0.00–0.07)
Basophils Absolute: 0.1 10*3/uL (ref 0.0–0.1)
Basophils Relative: 1 %
Eosinophils Absolute: 0.1 10*3/uL (ref 0.0–0.5)
Eosinophils Relative: 1 %
HCT: 48.7 % (ref 39.0–52.0)
Hemoglobin: 16.2 g/dL (ref 13.0–17.0)
Immature Granulocytes: 0 %
Lymphocytes Relative: 28 %
Lymphs Abs: 1.8 10*3/uL (ref 0.7–4.0)
MCH: 29.8 pg (ref 26.0–34.0)
MCHC: 33.3 g/dL (ref 30.0–36.0)
MCV: 89.7 fL (ref 80.0–100.0)
Monocytes Absolute: 0.4 10*3/uL (ref 0.1–1.0)
Monocytes Relative: 7 %
Neutro Abs: 4.1 10*3/uL (ref 1.7–7.7)
Neutrophils Relative %: 63 %
Platelets: 298 10*3/uL (ref 150–400)
RBC: 5.43 MIL/uL (ref 4.22–5.81)
RDW: 12.7 % (ref 11.5–15.5)
WBC: 6.5 10*3/uL (ref 4.0–10.5)
nRBC: 0 % (ref 0.0–0.2)

## 2020-07-11 LAB — LIPASE, BLOOD: Lipase: 38 U/L (ref 11–51)

## 2020-07-11 NOTE — ED Triage Notes (Signed)
C/o right rib pain onser 10 months ago after having covid and coughing a lot.

## 2020-07-11 NOTE — ED Notes (Signed)
Pt to ultrasound

## 2020-07-11 NOTE — ED Notes (Signed)
Patient transported to ultrasound.

## 2020-07-11 NOTE — ED Provider Notes (Signed)
[facility] EMERGENCY DEPARTMENT Provider Note  CSN: [id number] Arrival date & time: 07/11/20 3016    History Chief Complaint  Patient presents with  . Chest Pain    HPI  [facility] Gwenyth Ober Lily Kocher is a 45 y.o. male with no significant PMH reports he had Covid about 10 months ago and since that time has had intermittent episode of sharp stabbing pain in his RUQ, not chest/rib pain as reported in triage. He reports pain is worse after eating. Usually goes away on it's own. No particular reason he came today for evaluation other than he is tired of living with it.     Past Medical History:  Diagnosis Date  . 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 FLEXOR TENDON REPAIR Right 09/17/2014   Procedure: FLEXOR TENDON REPAIR right long finger ;  Surgeon: Mack Hook, MD;  Location: [facility];  Service: Orthopedics;  Laterality: Right;  . INCISION AND DRAINAGE PERIRECTAL ABSCESS N/A 09/17/2012   Procedure: IRRIGATION AND DEBRIDEMENT PERIRECTAL ABSCESS;  Surgeon: Liz Malady, MD;  Location: [facility] OR;  Service: General;  Laterality: N/A;  . INCISION AND DRAINAGE PERIRECTAL ABSCESS N/A 05/30/2015   Procedure: IRRIGATION AND DEBRIDEMENT PERIRECTAL ABSCESS;  Surgeon: Harriette Bouillon, MD;  Location: [facility] OR;  Service: General;  Laterality: N/A;  . TENOLYSIS Right 03/11/2015   Procedure: RIGHT LONG FINGER Z-PLASTY, JOINT RELEASE, DIGITAL NEUROPLASTY AND FLEXOR TENOLYSIS;  Surgeon: Mack Hook, MD;  Location: [facility];  Service: Orthopedics;  Laterality: Right;  ANESTHESIA WITH PRE-OP BLOCK    No family history on file.  Social History   Tobacco Use  . Smoking status: Former Smoker    Quit date: 02/28/2009    Years since quitting: 11.3  . Smokeless tobacco: Never Used  Substance Use  Topics  . Alcohol use: Yes    Comment: occasionally  . Drug use: No     Home Medications Prior to Admission medications   Medication Sig Start Date End Date Taking? Authorizing Provider  acetaminophen (TYLENOL) 500 MG tablet Take 1,000 mg by mouth every 6 (six) hours as needed.    [provider]  clotrimazole-betamethasone (LOTRISONE) cream Apply 1 application topically 2 (two) times daily. 02/03/18   Georgina Quint, MD  ibuprofen (ADVIL,MOTRIN) 200 MG tablet Take 200 mg by mouth every 6 (six) hours as needed.    [provider]  oxyCODONE-acetaminophen (ROXICET) 5-325 MG tablet Take 1-2 tablets by mouth every 4 (four) hours as needed for moderate pain. Patient not taking: Reported on 05/10/2016 05/31/15   Sherrie George, PA-C  saccharomyces boulardii (FLORASTOR) 250 MG capsule You can buy this at any drug store.  Follow package instructions and use for a week after you finish antibiotic. Patient not taking: Reported on 05/10/2016 05/31/15   Sherrie George, PA-C     Allergies    Patient has no known allergies.   Review of Systems   Review of Systems A comprehensive review of systems was completed and negative except as noted in HPI.    Physical Exam BP (!) 147/95 (BP Location: Right Arm)   Pulse 73   Temp 98.2 F (36.8 C) (Oral)   Resp 16   SpO2 100%   Physical Exam Vitals and nursing note reviewed.  Constitutional:  Appearance: Normal appearance.  HENT:     Head: Normocephalic and atraumatic.     Nose: Nose normal.     Mouth/Throat:     Mouth: Mucous membranes are moist.  Eyes:     Extraocular Movements: Extraocular movements intact.     Conjunctiva/sclera: Conjunctivae normal.  Cardiovascular:     Rate and Rhythm: Normal rate.  Pulmonary:     Effort: Pulmonary effort is normal.     Breath sounds: Normal breath sounds.  Abdominal:     General: Abdomen is flat.     Palpations: Abdomen is soft.     Tenderness: There is abdominal  tenderness (RUQ). There is no guarding.  Musculoskeletal:        General: No swelling. Normal range of motion.     Cervical back: Neck supple.  Skin:    General: Skin is warm and dry.  Neurological:     General: No focal deficit present.     Mental Status: He is alert.  Psychiatric:        Mood and Affect: Mood normal.      ED Results / Procedures / Treatments   Labs (all labs ordered are listed, but only abnormal results are displayed) Labs Reviewed  COMPREHENSIVE METABOLIC PANEL - Abnormal; Notable for the following components:      Result Value   Glucose, Bld 107 (*)    All other components within normal limits  LIPASE, BLOOD  CBC WITH DIFFERENTIAL/PLATELET    EKG EKG Interpretation  Date/Time:  Friday Jul 11 2020 14:49:01 EDT Ventricular Rate:  63 PR Interval:  142 QRS Duration: 98 QT Interval:  398 QTC Calculation: 407 R Axis:   -31 Text Interpretation: Normal sinus rhythm Left axis deviation Abnormal ECG No significant change since last tracing Confirmed by Susy Frizzle [phone]) on 07/11/2020 3:02:44 PM   Radiology DG Chest 2 View  Result Date: 07/11/2020 CLINICAL DATA:  Rib pain.  Cough. EXAM: CHEST - 2 VIEW COMPARISON:  Two-view chest x-ray 06/23/2011 FINDINGS: Heart size is normal. Lung volumes are low. Minimal atelectasis is present at the lung bases. No edema or effusion is present. No other focal airspace disease is present. Visualized soft tissues and bony thorax are unremarkable. IMPRESSION: 1. Low lung volumes and minimal bibasilar atelectasis. 2. No acute cardiopulmonary disease. Electronically Signed   By: Marin Roberts M.D.   On: 07/11/2020 13:04   US Abdomen Limited  Result Date: 07/11/2020 CLINICAL DATA:  Right upper quadrant pain after eating EXAM: ULTRASOUND ABDOMEN LIMITED RIGHT UPPER QUADRANT COMPARISON:  CT abdomen pelvis 05/30/2015 FINDINGS: Gallbladder: No gallstones or wall thickening visualized. No sonographic Murphy sign noted by  sonographer. Common bile duct: Diameter: 0.4 cm, within normal limits Liver: No focal lesion identified. Parenchymal echogenicity appears slightly increased. Portal vein is patent on color Doppler imaging with normal direction of blood flow towards the liver. Other: None. IMPRESSION: 1.  No acute finding sonographically in the right upper quadrant. 2. Slightly increased liver parenchymal echogenicity which is nonspecific but most commonly represents fatty infiltration. Electronically Signed   By: Emmaline Kluver M.D.   On: 07/11/2020 14:15    Procedures Procedures  Medications Ordered in the ED Medications - No data to display   MDM Rules/Calculators/A&P MDM Patient's initial report was rib pain after coughing, but on further history he is pointing to RUQ and reports pain worse after eating raising concern for cholelithiasis or other hepatobiliary process. Will check labs and send for [place]. CXR is unremarkable.  ED Course  I have reviewed the triage vital signs and the nursing notes.  Pertinent labs & imaging results that were available during my care of the patient were reviewed by me and considered in my medical decision making (see chart for details).  Clinical Course as of 07/11/20 1521  Fri Jul 11, 2020  1435 [place] is neg. CBC is normal.  [CS]  1503 CMP and lipase are normal.  [CS]  1519 Patient's abdomen remains benign, labs and imaging are neg. Rx for Naprosyn, recommend PCP follow up. RTED for any other concerns.  [CS]    Clinical Course User Index [CS] Pollyann Savoy, MD    Final Clinical Impression(s) / ED Diagnoses Final diagnoses:  RUQ abdominal pain    Rx / DC Orders ED Discharge Orders    None       Pollyann Savoy, MD 07/11/20 1521

## 2023-02-19 IMAGING — US US ABDOMEN LIMITED RUQ/ASCITES
1 series · 14 of 25 positions shown · non-contrast
Comparison: CT abdomen pelvis 05/30/2015

CLINICAL DATA: Right upper quadrant pain after eating

EXAM:
ULTRASOUND ABDOMEN LIMITED RIGHT UPPER QUADRANT

[Series 1: us abdomen limited · 14 of 59 slices shown]
[im 1/59]
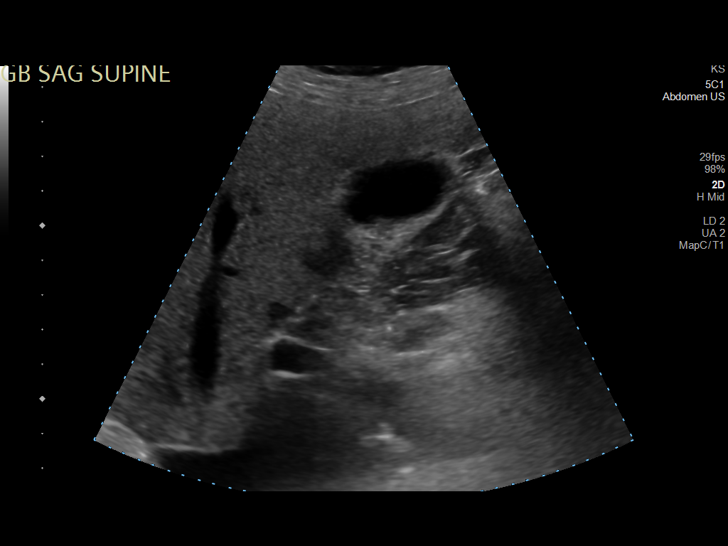
[im 5/59]
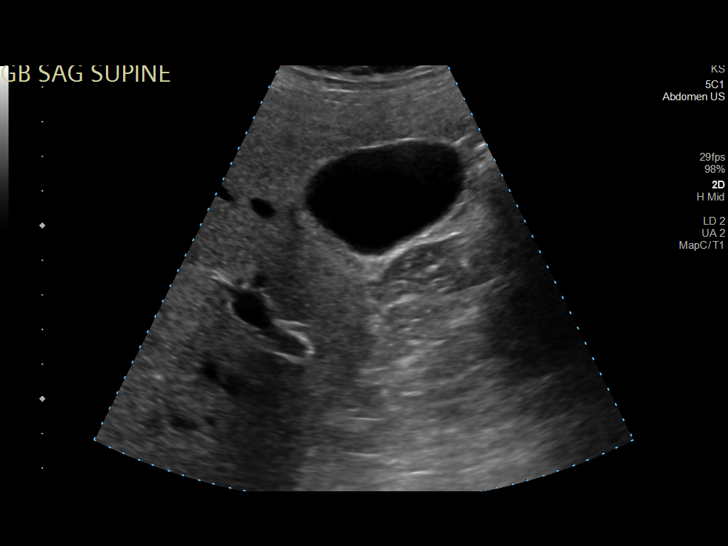
[im 10/59]
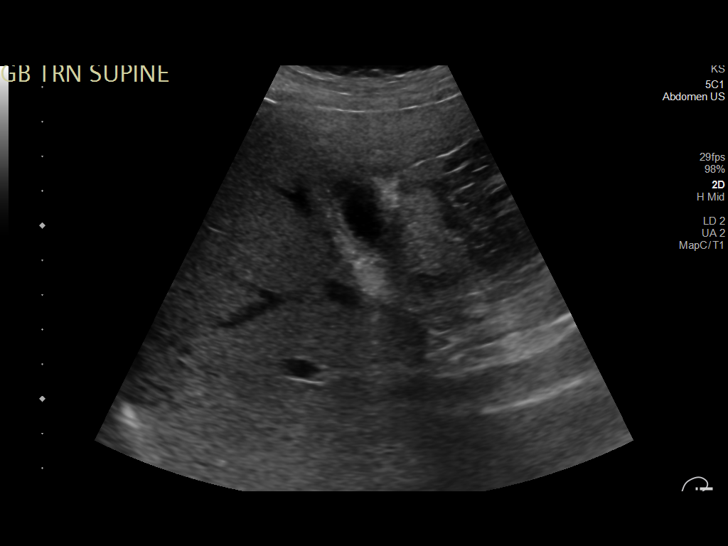
[im 15/59]
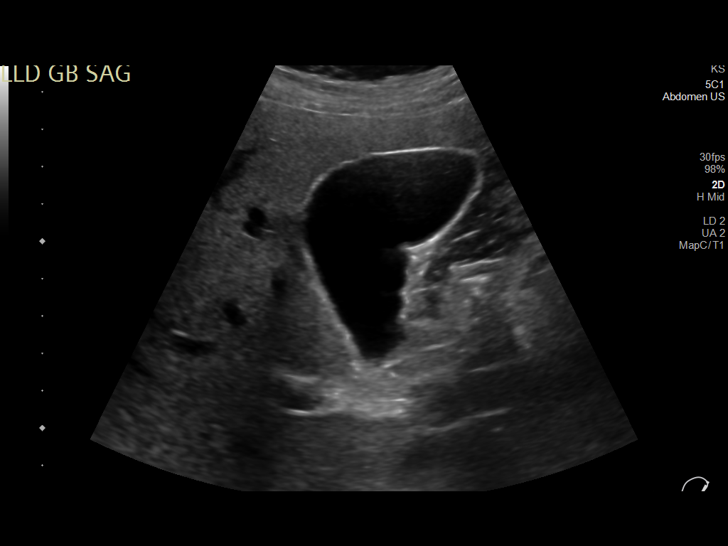
[im 20/59]
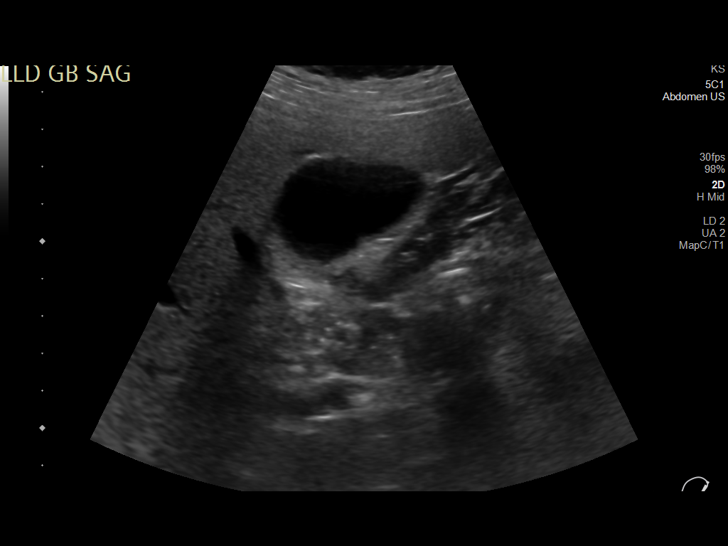
[im 22/59]
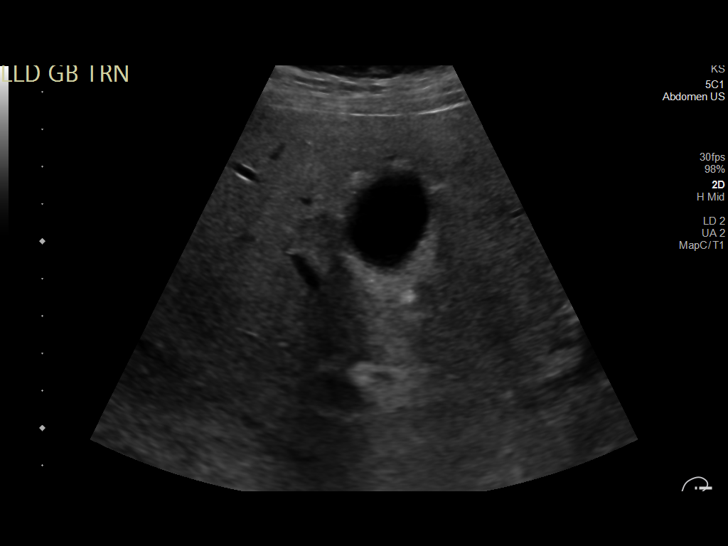
[im 27/59]
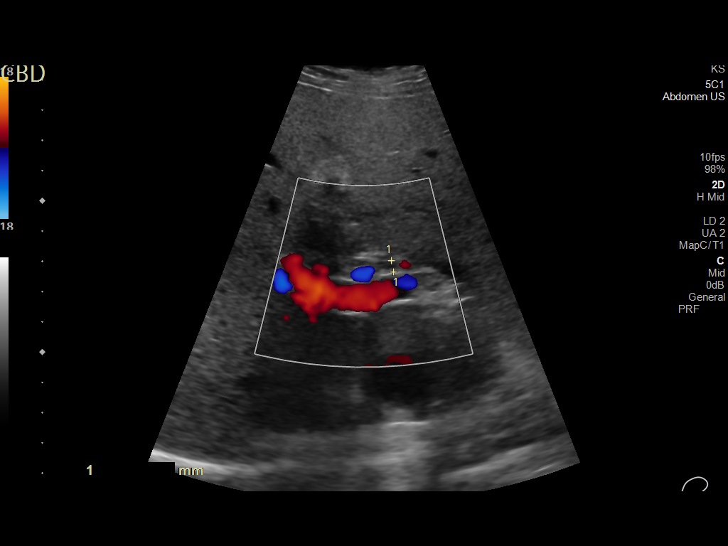
[im 32/59]
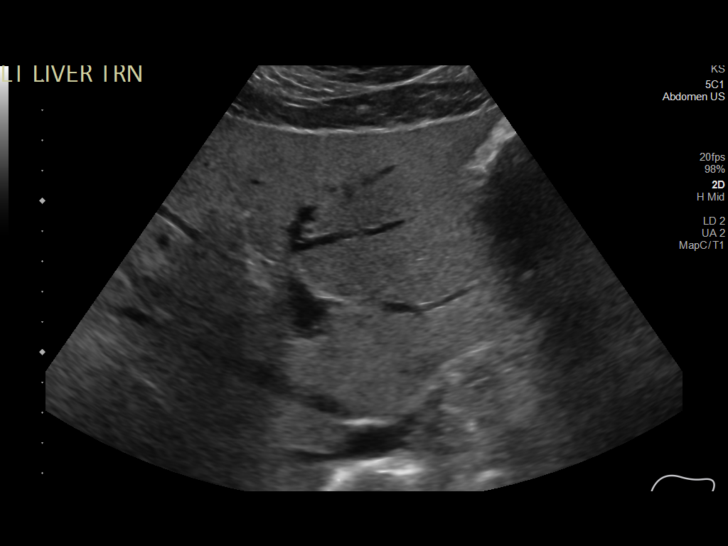
[im 37/59]
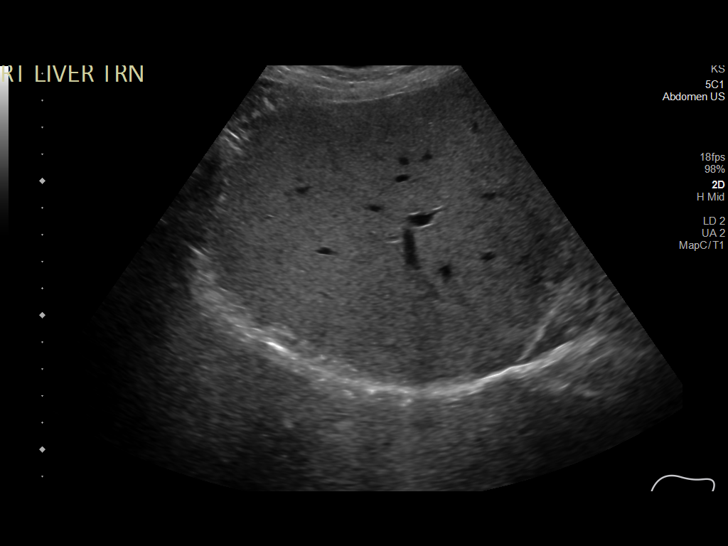
[im 39/59]
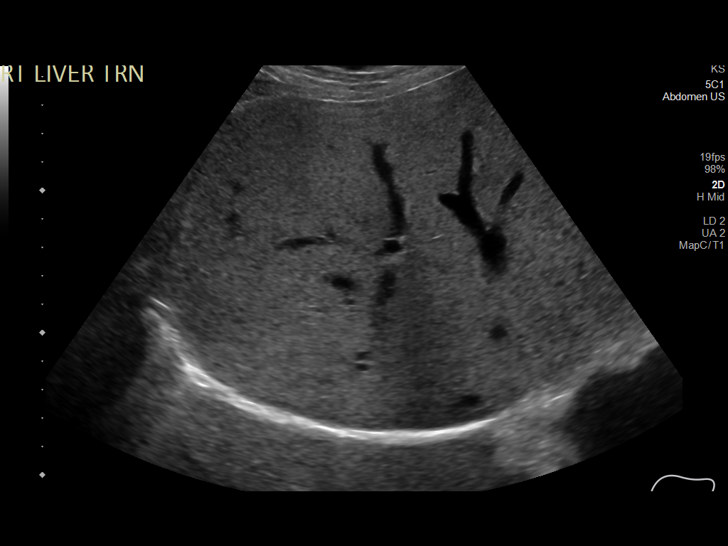
[im 44/59]
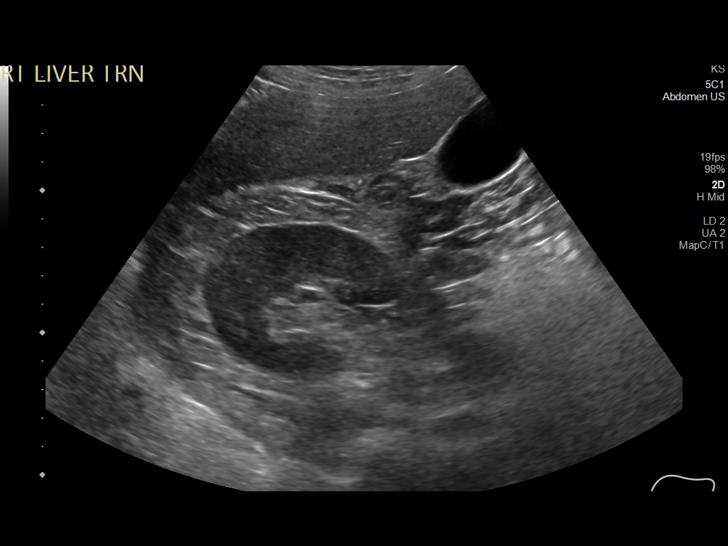
[im 49/59]
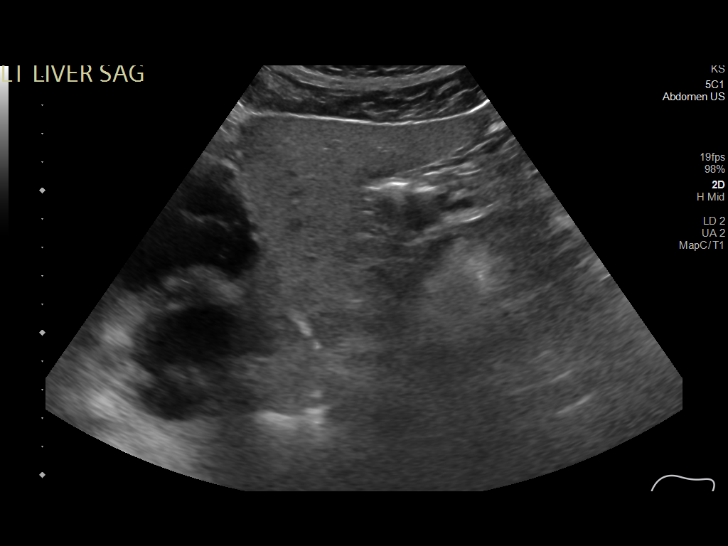
[im 54/59]
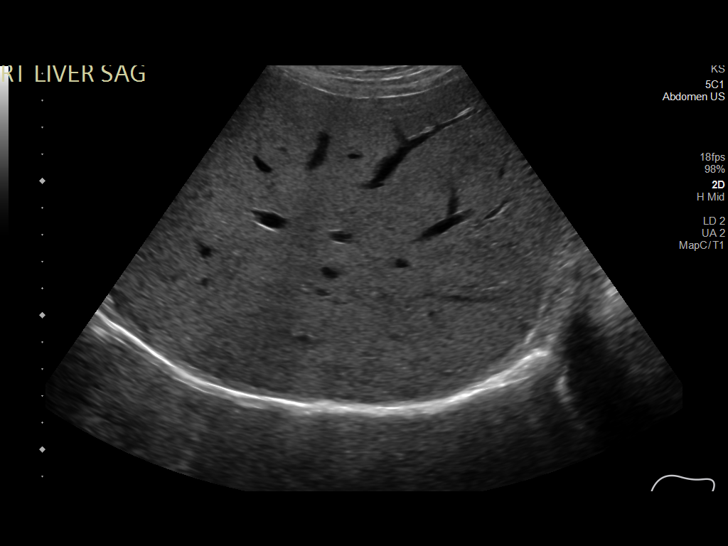
[im 59/59]
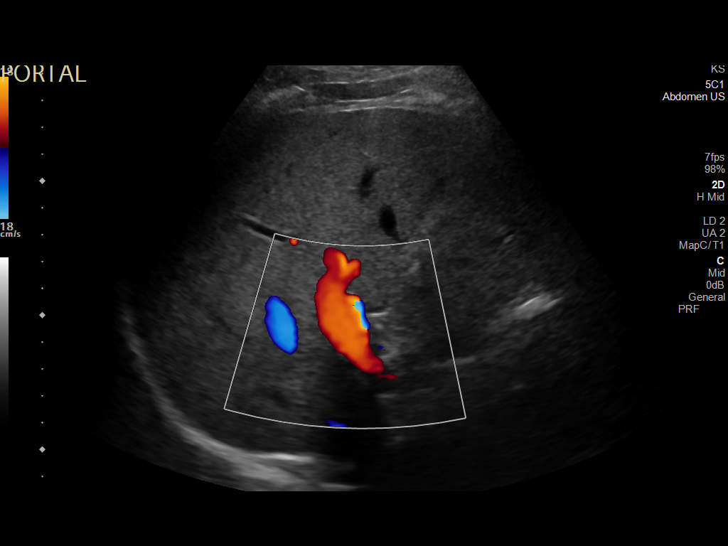

[14 of 25 positions shown; findings below may reference images not displayed]

FINDINGS: Gallbladder:

No gallstones or wall thickening visualized. No sonographic Murphy
sign noted by sonographer.

Common bile duct:

Diameter: 0.4 cm, within normal limits

Liver:

No focal lesion identified. Parenchymal echogenicity appears
slightly increased. Portal vein is patent on color Doppler imaging
with normal direction of blood flow towards the liver.

Other: None.
IMPRESSION: 1.  No acute finding sonographically in the right upper quadrant.

2. Slightly increased liver parenchymal echogenicity which is
nonspecific but most commonly represents fatty infiltration.
